# Patient Record
Sex: Female | Born: 1990 | Race: Black or African American | Hispanic: No | Marital: Single | Smoking: Never smoker
Health system: Southern US, Community
[De-identification: ages and names within clinical notes are randomized; demographics above are authoritative.]

## PROBLEM LIST (undated history)

## (undated) DIAGNOSIS — Z789 Other specified health status: Secondary | ICD-10-CM

## (undated) HISTORY — PX: NO PAST SURGERIES: SHX2092

---
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
## 2010-07-24 ENCOUNTER — Emergency Department (HOSPITAL_COMMUNITY): Payer: Self-pay

## 2010-07-24 ENCOUNTER — Emergency Department (HOSPITAL_COMMUNITY)
Admission: EM | Admit: 2010-07-24 | Discharge: 2010-07-24 | Disposition: A | Discharge status: Home / Self Care | Payer: Self-pay | Attending: Emergency Medicine | Admitting: Emergency Medicine

## 2010-07-24 DIAGNOSIS — S01119A Laceration without foreign body of unspecified eyelid and periocular area, initial encounter: Secondary | ICD-10-CM | POA: Insufficient documentation

## 2010-10-25 LAB — RUBELLA ANTIBODY, IGM: Rubella: IMMUNE

## 2010-10-25 LAB — RPR: RPR: NONREACTIVE

## 2010-10-25 LAB — ANTIBODY SCREEN: Antibody Screen: NEGATIVE

## 2010-10-25 LAB — HIV ANTIBODY (ROUTINE TESTING W REFLEX): HIV: NONREACTIVE

## 2011-03-15 NOTE — L&D Delivery Note (Signed)
Delivery Note  Pt was complete at 0240 without a strong urge to push, so labored down until about 0315. Pushed well, FHR 150's with some mild variables, overall reassuring   At 4:15 AM a viable female was delivered via Vaginal, Spontaneous Delivery (Presentation: Right Occiput Anterior). Loose nuchal cord reduced easily. Infant placed on mom's abdomen, dried and stimulated, cord doubly clamped and FOB cut cord.   APGAR: 9, 9; weight 6 lb 12 oz (3062 g).   Placenta status: Intact, Spontaneous.  Cord: 3 vessels with the following complications: None.   Routine cord blood collected Placenta sent to path for prolonged ROM, marginal cord insertion noted  Anesthesia: Epidural  Episiotomy: None Lacerations: None Suture Repair: n/a Est. Blood Loss (mL): 200  Mom to postpartum.  Baby to nursery-stable. Skin-skin Mom plans to BF Plans OP circumcision Dr Normand Sloop, notified   Raye Slyter M 04/25/2011, 5:11 AM

## 2011-04-01 LAB — GC/CHLAMYDIA PROBE AMP, GENITAL
Chlamydia: NEGATIVE
Gonorrhea: NEGATIVE

## 2011-04-04 LAB — STREP B DNA PROBE: GBS: NEGATIVE

## 2011-04-24 ENCOUNTER — Inpatient Hospital Stay (HOSPITAL_COMMUNITY): Payer: Medicaid Other | Admitting: Anesthesiology

## 2011-04-24 ENCOUNTER — Encounter (HOSPITAL_COMMUNITY): Payer: Self-pay | Admitting: *Deleted

## 2011-04-24 ENCOUNTER — Encounter (HOSPITAL_COMMUNITY): Payer: Self-pay | Admitting: Anesthesiology

## 2011-04-24 ENCOUNTER — Inpatient Hospital Stay (HOSPITAL_COMMUNITY)
Admission: AD | Admit: 2011-04-24 | Discharge: 2011-04-27 | DRG: 775 | Disposition: A | Discharge status: Home / Self Care | Payer: Medicaid Other | Source: Ambulatory Visit | Attending: Obstetrics and Gynecology | Admitting: Obstetrics and Gynecology

## 2011-04-24 DIAGNOSIS — IMO0002 Reserved for concepts with insufficient information to code with codable children: Secondary | ICD-10-CM | POA: Diagnosis present

## 2011-04-24 DIAGNOSIS — O4290 Premature rupture of membranes, unspecified as to length of time between rupture and onset of labor, unspecified weeks of gestation: Principal | ICD-10-CM | POA: Diagnosis present

## 2011-04-24 DIAGNOSIS — O9081 Anemia of the puerperium: Secondary | ICD-10-CM | POA: Diagnosis not present

## 2011-04-24 DIAGNOSIS — Z3400 Encounter for supervision of normal first pregnancy, unspecified trimester: Secondary | ICD-10-CM

## 2011-04-24 HISTORY — DX: Other specified health status: Z78.9

## 2011-04-24 LAB — POCT FERN TEST: Fern Test: POSITIVE

## 2011-04-24 LAB — CBC
Hemoglobin: 11.9 g/dL — ABNORMAL LOW (ref 12.0–15.0)
MCH: 31.5 pg (ref 26.0–34.0)
MCHC: 34.2 g/dL (ref 30.0–36.0)
Platelets: 190 10*3/uL (ref 150–400)
RDW: 13.3 % (ref 11.5–15.5)

## 2011-04-24 MED ORDER — BUTORPHANOL TARTRATE 2 MG/ML IJ SOLN: 1.0000 mg | INTRAMUSCULAR | Status: DC | PRN

## 2011-04-24 MED ORDER — CITRIC ACID-SODIUM CITRATE 334-500 MG/5ML PO SOLN: 30.0000 mL | ORAL | Status: DC | PRN

## 2011-04-24 MED ORDER — DIPHENHYDRAMINE HCL 50 MG/ML IJ SOLN: 12.5000 mg | INTRAMUSCULAR | Status: DC | PRN

## 2011-04-24 MED ORDER — ACETAMINOPHEN 325 MG PO TABS
650.0000 mg | ORAL_TABLET | ORAL | Status: DC | PRN
  Administered 2011-04-25: 650 mg via ORAL
  Filled 2011-04-24: qty 2

## 2011-04-24 MED ORDER — FLEET ENEMA 7-19 GM/118ML RE ENEM: 1.0000 | ENEMA | RECTAL | Status: DC | PRN

## 2011-04-24 MED ORDER — LACTATED RINGERS IV SOLN
500.0000 mL | INTRAVENOUS | Status: DC | PRN
  Administered 2011-04-24: 1000 mL via INTRAVENOUS

## 2011-04-24 MED ORDER — OXYTOCIN BOLUS FROM INFUSION
500.0000 mL | Freq: Once | INTRAVENOUS | Status: DC
  Filled 2011-04-24: qty 1000
  Filled 2011-04-24: qty 500

## 2011-04-24 MED ORDER — OXYTOCIN 20 UNITS IN LACTATED RINGERS INFUSION - SIMPLE
1.0000 m[IU]/min | INTRAVENOUS | Status: DC
  Administered 2011-04-24: 3 m[IU]/min via INTRAVENOUS
  Administered 2011-04-24: 1 m[IU]/min via INTRAVENOUS

## 2011-04-24 MED ORDER — TERBUTALINE SULFATE 1 MG/ML IJ SOLN: 0.2500 mg | Freq: Once | INTRAMUSCULAR | Status: AC | PRN

## 2011-04-24 MED ORDER — FENTANYL 2.5 MCG/ML BUPIVACAINE 1/10 % EPIDURAL INFUSION (WH - ANES)
14.0000 mL/h | INTRAMUSCULAR | Status: DC
  Administered 2011-04-24 – 2011-04-25 (×2): 14 mL/h via EPIDURAL
  Filled 2011-04-24 (×2): qty 60

## 2011-04-24 MED ORDER — EPHEDRINE 5 MG/ML INJ: 10.0000 mg | INTRAVENOUS | Status: DC | PRN

## 2011-04-24 MED ORDER — OXYTOCIN 20 UNITS IN LACTATED RINGERS INFUSION - SIMPLE
125.0000 mL/h | Freq: Once | INTRAVENOUS | Status: AC
  Administered 2011-04-25: 999 mL/h via INTRAVENOUS

## 2011-04-24 MED ORDER — OXYCODONE-ACETAMINOPHEN 5-325 MG PO TABS: 1.0000 | ORAL_TABLET | ORAL | Status: DC | PRN

## 2011-04-24 MED ORDER — LIDOCAINE HCL (PF) 1 % IJ SOLN
30.0000 mL | INTRAMUSCULAR | Status: DC | PRN
  Filled 2011-04-24: qty 30

## 2011-04-24 MED ORDER — EPHEDRINE 5 MG/ML INJ
10.0000 mg | INTRAVENOUS | Status: DC | PRN
  Filled 2011-04-24: qty 4

## 2011-04-24 MED ORDER — LIDOCAINE HCL (PF) 1 % IJ SOLN
INTRAMUSCULAR | Status: DC | PRN
  Administered 2011-04-24 (×3): 4 mL

## 2011-04-24 MED ORDER — IBUPROFEN 600 MG PO TABS: 600.0000 mg | ORAL_TABLET | Freq: Four times a day (QID) | ORAL | Status: DC | PRN

## 2011-04-24 MED ORDER — LACTATED RINGERS IV SOLN
INTRAVENOUS | Status: DC
  Administered 2011-04-24 (×2): via INTRAVENOUS

## 2011-04-24 MED ORDER — FENTANYL CITRATE 0.05 MG/ML IJ SOLN
100.0000 ug | INTRAMUSCULAR | Status: DC | PRN
  Administered 2011-04-24 (×3): 100 ug via INTRAVENOUS
  Filled 2011-04-24 (×4): qty 2

## 2011-04-24 MED ORDER — PHENYLEPHRINE 40 MCG/ML (10ML) SYRINGE FOR IV PUSH (FOR BLOOD PRESSURE SUPPORT): 80.0000 ug | PREFILLED_SYRINGE | INTRAVENOUS | Status: DC | PRN

## 2011-04-24 MED ORDER — PHENYLEPHRINE 40 MCG/ML (10ML) SYRINGE FOR IV PUSH (FOR BLOOD PRESSURE SUPPORT)
80.0000 ug | PREFILLED_SYRINGE | INTRAVENOUS | Status: DC | PRN
  Filled 2011-04-24: qty 5

## 2011-04-24 MED ORDER — LACTATED RINGERS IV SOLN: 500.0000 mL | Freq: Once | INTRAVENOUS | Status: DC

## 2011-04-24 MED ORDER — ONDANSETRON HCL 4 MG/2ML IJ SOLN
4.0000 mg | Freq: Four times a day (QID) | INTRAMUSCULAR | Status: DC | PRN
  Administered 2011-04-24: 4 mg via INTRAVENOUS
  Filled 2011-04-24: qty 2

## 2011-04-24 NOTE — Progress Notes (Signed)
Patient ID: Alexis Henderson, female   DOB: 03-19-1990, 21 y.o.   MRN: [id number] .Subjective: Fentanyl helping, declines epidural, requests VE   Objective: BP 117/70  Pulse 95  Temp(Src) 98.1 F (36.7 C) (Oral)  Resp 18  Ht 5\' 2"  (1.575 m)  Wt 62.415 kg (137 lb 9.6 oz)  BMI 25.17 kg/m2   FHT:  FHR: 130 bpm, variability: moderate,  accelerations:  Present,  decelerations:  Absent UC:   regular, every 2 minutes SVE:   Dilation: 2 Effacement (%): 90;100 Station: -1;0 Exam by:: S. Humphrey Guerreiro CNM (via [place])  Pitocin at 3mu  Assessment / Plan: pitocin augmentation for SROM, no cervical change GBS neg  Consider IUPC if no change  Fetal Wellbeing:  Category I Pain Control:  Fentanyl  Update physician PRN  [name] 04/24/2011, 5:54 PM

## 2011-04-24 NOTE — Progress Notes (Signed)
Dr. Normand Sloop updated on paitent status. To consult with S. Lillard CNM.

## 2011-04-24 NOTE — Progress Notes (Signed)
Encouraged pt to change positions, offered labor ball.Marland KitchenMarland Kitchen

## 2011-04-24 NOTE — Progress Notes (Signed)
Pt presents to [facility] with chief complaint of back pain, here for labor eval. Pt says the contractions started this morning around 4am, Pt is 104w4d.

## 2011-04-24 NOTE — Progress Notes (Signed)
Pt reports ahving lower back pain on and off since earlier this morning. Reports mucus plug came out last night and having good fetal movement.

## 2011-04-24 NOTE — Progress Notes (Signed)
Patient ID: Alexis Henderson, female   DOB: [birth year], 21 y.o.   MRN: [id number] .Subjective: C/o increased pain now, requests epidural, breathing with ctx, family supportive at bs   Objective: BP 120/51  Pulse 97  Temp(Src) 98 F (36.7 C) (Oral)  Resp 20  Ht 5\' 2"  (1.575 m)  Wt 62.415 kg (137 lb 9.6 oz)  BMI 25.17 kg/m2   FHT:  FHR: 140 bpm, variability: moderate,  accelerations:  Present,  decelerations:  Present variables,  UC:   regular, every 1-3 minutes SVE:   Dilation: 2.5 Effacement (%): 100 Station: +1 Exam by:: [employer] RN  Pitocin at 5mu cx is now 4cm  Assessment / Plan: Induction of labor due to PROM,  progressing well on pitocin GBS neg Some uterine tachysystole, will decrease pitocin and get epidural Consider IUPC    Fetal Wellbeing:  Category I Pain Control:  Fentanyl  Dr Normand Sloop updated  [name] 04/24/2011, 10:18 PM

## 2011-04-24 NOTE — Anesthesia Preprocedure Evaluation (Signed)
Anesthesia Evaluation  Patient identified by MRN, date of birth, ID band Patient awake    Reviewed: Allergy & Precautions, H&P , NPO status , Patient's Chart, lab work & pertinent test results, reviewed documented beta blocker date and time   History of Anesthesia Complications Negative for: history of anesthetic complications  Airway Mallampati: II TM Distance: >3 FB Neck ROM: full    Dental  (+) Teeth Intact   Pulmonary neg pulmonary ROS,  clear to auscultation        Cardiovascular neg cardio ROS regular Normal    Neuro/Psych Negative Neurological ROS  Negative Psych ROS   GI/Hepatic negative GI ROS, Neg liver ROS,   Endo/Other  Negative Endocrine ROS  Renal/GU negative Renal ROS     Musculoskeletal   Abdominal   Peds  Hematology negative hematology ROS (+)   Anesthesia Other Findings   Reproductive/Obstetrics (+) Pregnancy                           Anesthesia Physical Anesthesia Plan  ASA: II  Anesthesia Plan: Epidural   Post-op Pain Management:    Induction:   Airway Management Planned:   Additional Equipment:   Intra-op Plan:   Post-operative Plan:   Informed Consent: I have reviewed the patients History and Physical, chart, labs and discussed the procedure including the risks, benefits and alternatives for the proposed anesthesia with the patient or authorized representative who has indicated his/her understanding and acceptance.     Plan Discussed with:   Anesthesia Plan Comments:         Anesthesia Quick Evaluation  

## 2011-04-24 NOTE — Progress Notes (Signed)
New orders received.

## 2011-04-24 NOTE — Progress Notes (Signed)
Bedside US completed and vertex position confirmed per. Gevena Barre CNM

## 2011-04-24 NOTE — Progress Notes (Signed)
Maternal heart rate noted on fetal tracing. Patient sitting in high fowlers vominting.

## 2011-04-24 NOTE — H&P (Signed)
Alexis Henderson is a 21 y.o. female presenting for labor.Pt had SROM with clear fluid and mild blood tinge at 0400.  Pt also with contractions q Pregnancy complicated by 1. Circumvillate placenta with marginal CI  Pt had normal QUAD screens and growth [place] 2. BV treated with Flagyl @IPILAPH @ OB History    Grav Para Term Preterm Abortions TAB SAB Ect Mult Living   1 0 0 0 0 0 0 0 0 0      Past Medical History  Diagnosis Date  . No pertinent past medical history    Past Surgical History  Procedure Date  . No past surgeries    Family History: family history includes Diabetes in her father. Social History:  reports that she has never smoked. She does not have any smokeless tobacco history on file. She reports that she does not drink alcohol or use illicit drugs.  Review of Systems - History obtained from the patient General ROS: negative Psychological ROS: negative Respiratory ROS: no cough, shortness of breath, or wheezing Cardiovascular ROS: no chest pain or dyspnea on exertion Gastrointestinal ROS: no abdominal pain, change in bowel habits, or black or bloody stools Fetal heart tones 145-150 reactive toco q  Dilation: 2 Effacement (%): 100 Station: 0 Exam by:: Dr. Normand Sloop Blood pressure 127/76, pulse 94, temperature 99.9 F (37.7 C), temperature source Oral, resp. rate 18, height 5\' 2"  (1.575 m), weight 62.415 kg (137 lb 9.6 oz). Physical Examination: General appearance - alert, well appearing, and in no distress, oriented to person, place, and time and anxious Eyes - pupils equal and reactive, extraocular eye movements intact Ears - bilateral TM's and external ear canals normal, not examined Chest - clear to auscultation, no wheezes, rales or rhonchi, symmetric air entry Heart - normal rate, regular rhythm, normal S1, S2, no murmurs, rubs, clicks or gallops Abdomen - soft, nontender, nondistended, no masses or organomegaly Pelvic - normal external genitalia, ,  VULVA: normal appearing vulva with no masses, tenderness or lesions Back exam - full range of motion, no tenderness, palpable spasm or pain on motion Neurological - alert, oriented, normal speech, no focal findings or movement disorder noted Musculoskeletal - no joint tenderness, deformity or swelling Extremities - peripheral pulses normal, no pedal edema, no clubbing or cyanosis Skin - normal coloration and turgor, no rashes, no suspicious skin lesions noted Prenatal labs: ABO, Rh:   Antibody:   Rubella:   RPR:    HBsAg:    HIV:    GBS: Negative (01/21 0000)   Assessment/Plan: Active labor with SROM at term Epidural PRN Anticipate SVD   Alexis Henderson A 04/24/2011, 12:36 PM

## 2011-04-24 NOTE — Anesthesia Procedure Notes (Signed)
Epidural Patient location during procedure: OB Start time: 04/24/2011 10:39 PM Reason for block: procedure for pain  Staffing Performed by: anesthesiologist   Preanesthetic Checklist Completed: patient identified, site marked, surgical consent, pre-op evaluation, timeout performed, IV checked, risks and benefits discussed and monitors and equipment checked  Epidural Patient position: sitting Prep: site prepped and draped and DuraPrep Patient monitoring: continuous pulse ox and blood pressure Approach: midline Injection technique: LOR air  Needle:  Needle type: Tuohy  Needle gauge: 17 G Needle length: 9 cm Catheter type: closed end flexible Catheter size: 19 Gauge Test dose: negative  Assessment Events: blood not aspirated, injection not painful, no injection resistance, negative IV test and no paresthesia  Additional Notes Discussed risk of headache, infection, bleeding, nerve injury and failed or incomplete block.  Patient voices understanding and wishes to proceed.

## 2011-04-24 NOTE — Progress Notes (Signed)
Patient ID: Alexis Henderson, female   DOB: 05/09/1990, 21 y.o.   MRN: [id number] .Subjective: Pt c/o increased pain, desires IV meds, not ready for epidural yet, agrees to start pitocin    Objective: BP 125/88  Pulse 100  Temp(Src) 97.7 F (36.5 C) (Oral)  Resp 18  Ht 5\' 2"  (1.575 m)  Wt 62.415 kg (137 lb 9.6 oz)  BMI 25.17 kg/m2   FHT:  FHR: 150 bpm, variability: moderate,  accelerations:  Present,  decelerations:  Absent UC:   irregular, every 3-6 minutes SVE:   Dilation: 2 Effacement (%): 90;100 Station: -1;0 Exam by:: S. Shrihaan Porzio CNM (via [place])  Vertex confirmed via UA, VE deferred  Assessment / Plan: SROM GBS neg  Will give fentanyl IVP Start pitocin   Fetal Wellbeing:  Category I Pain Control:  Fentanyl  Update physician PRN  [name] 04/24/2011, 3:01 PM

## 2011-04-25 ENCOUNTER — Other Ambulatory Visit: Payer: Self-pay | Admitting: Obstetrics and Gynecology

## 2011-04-25 ENCOUNTER — Encounter (HOSPITAL_COMMUNITY): Payer: Self-pay | Admitting: *Deleted

## 2011-04-25 LAB — ABO/RH: ABO/RH(D): O POS

## 2011-04-25 MED ORDER — ONDANSETRON HCL 4 MG/2ML IJ SOLN: 4.0000 mg | INTRAMUSCULAR | Status: DC | PRN

## 2011-04-25 MED ORDER — BENZOCAINE-MENTHOL 20-0.5 % EX AERO: 1.0000 "application " | INHALATION_SPRAY | CUTANEOUS | Status: DC | PRN

## 2011-04-25 MED ORDER — DIBUCAINE 1 % RE OINT: 1.0000 "application " | TOPICAL_OINTMENT | RECTAL | Status: DC | PRN

## 2011-04-25 MED ORDER — SIMETHICONE 80 MG PO CHEW: 80.0000 mg | CHEWABLE_TABLET | ORAL | Status: DC | PRN

## 2011-04-25 MED ORDER — PRENATAL MULTIVITAMIN CH
1.0000 | ORAL_TABLET | Freq: Every day | ORAL | Status: DC
  Administered 2011-04-25 – 2011-04-27 (×3): 1 via ORAL
  Filled 2011-04-25 (×3): qty 1

## 2011-04-25 MED ORDER — TETANUS-DIPHTH-ACELL PERTUSSIS 5-2.5-18.5 LF-MCG/0.5 IM SUSP: 0.5000 mL | Freq: Once | INTRAMUSCULAR | Status: DC

## 2011-04-25 MED ORDER — BENZOCAINE-MENTHOL 20-0.5 % EX AERO
INHALATION_SPRAY | CUTANEOUS | Status: AC
  Administered 2011-04-25: 11:00:00
  Filled 2011-04-25: qty 56

## 2011-04-25 MED ORDER — IBUPROFEN 600 MG PO TABS
600.0000 mg | ORAL_TABLET | Freq: Four times a day (QID) | ORAL | Status: DC
  Administered 2011-04-25 – 2011-04-27 (×9): 600 mg via ORAL
  Filled 2011-04-25 (×9): qty 1

## 2011-04-25 MED ORDER — MEASLES, MUMPS & RUBELLA VAC ~~LOC~~ INJ
0.5000 mL | INJECTION | Freq: Once | SUBCUTANEOUS | Status: DC
  Filled 2011-04-25: qty 0.5

## 2011-04-25 MED ORDER — WITCH HAZEL-GLYCERIN EX PADS: 1.0000 "application " | MEDICATED_PAD | CUTANEOUS | Status: DC | PRN

## 2011-04-25 MED ORDER — ONDANSETRON HCL 4 MG PO TABS: 4.0000 mg | ORAL_TABLET | ORAL | Status: DC | PRN

## 2011-04-25 MED ORDER — OXYCODONE-ACETAMINOPHEN 5-325 MG PO TABS: 1.0000 | ORAL_TABLET | ORAL | Status: DC | PRN

## 2011-04-25 MED ORDER — LANOLIN HYDROUS EX OINT: TOPICAL_OINTMENT | CUTANEOUS | Status: DC | PRN

## 2011-04-25 MED ORDER — SENNOSIDES-DOCUSATE SODIUM 8.6-50 MG PO TABS
2.0000 | ORAL_TABLET | Freq: Every day | ORAL | Status: DC
  Administered 2011-04-25: 2 via ORAL

## 2011-04-25 MED ORDER — DIPHENHYDRAMINE HCL 25 MG PO CAPS: 25.0000 mg | ORAL_CAPSULE | Freq: Four times a day (QID) | ORAL | Status: DC | PRN

## 2011-04-25 MED ORDER — ZOLPIDEM TARTRATE 5 MG PO TABS: 5.0000 mg | ORAL_TABLET | Freq: Every evening | ORAL | Status: DC | PRN

## 2011-04-25 NOTE — Progress Notes (Signed)
SVD of viable female 

## 2011-04-25 NOTE — Anesthesia Postprocedure Evaluation (Signed)
  Anesthesia Post-op Note  Patient: Alexis Henderson  Procedure(s) Performed: * No procedures listed *  Patient Location: Mother/Baby  Anesthesia Type: Epidural  Level of Consciousness: awake, alert  and oriented  Airway and Oxygen Therapy: Patient Spontanous Breathing  Post-op Pain: none  Post-op Assessment: Post-op Vital signs reviewed, Patient's Cardiovascular Status Stable, No headache, No backache, No residual numbness and No residual motor weakness  Post-op Vital Signs: Reviewed and stable  Complications: No apparent anesthesia complications

## 2011-04-25 NOTE — Progress Notes (Signed)
Patient ID: Alexis Henderson, female   DOB: [birth year], 21 y.o.   MRN: [id number] .Subjective: Comfortable with epidural   Objective: BP 120/60  Pulse 98  Temp(Src) 98.3 F (36.8 C) (Oral)  Resp 18  Ht 5\' 2"  (1.575 m)  Wt 62.415 kg (137 lb 9.6 oz)  BMI 25.17 kg/m2   FHT:  FHR: 150 bpm, variability: moderate,  accelerations:  Present,  decelerations:  Present variables UC:   regular, every 2-3 minutes SVE:   Dilation: 6 Effacement (%): 100 Station: +1 Exam by:: Sanda Klein CNM  Pitocin at 3mu  Assessment / Plan: Induction of labor due to PROM,  progressing well on pitocin GBS  IUPC placed   Fetal Wellbeing:  Category I Pain Control:  Epidural  Update physician PRN  [name] 04/25/2011, 2:56 AM

## 2011-04-26 LAB — CBC
HCT: 31.6 % — ABNORMAL LOW (ref 36.0–46.0)
MCV: 93.5 fL (ref 78.0–100.0)
Platelets: 165 10*3/uL (ref 150–400)
RBC: 3.38 MIL/uL — ABNORMAL LOW (ref 3.87–5.11)
WBC: 13.2 10*3/uL — ABNORMAL HIGH (ref 4.0–10.5)

## 2011-04-26 NOTE — Progress Notes (Signed)
UR Chart review completed.  

## 2011-04-26 NOTE — Progress Notes (Signed)
Post Partum Day 1 Subjective: no complaints.  Doing well.  Breastfeeding going well. Undecided regarding contraception.  Denies syncope or dizziness.  Up ad lib.  Objective: Blood pressure 100/67, pulse 76, temperature 97.5 F (36.4 C), temperature source Oral, resp. rate 18, height 5\' 2"  (1.575 m), weight 62.415 kg (137 lb 9.6 oz), SpO2 99.00%, unknown if currently breastfeeding.  Physical Exam:  General: alert Lochia: appropriate Uterine Fundus: firm Incision: Clear perineum DVT Evaluation: No evidence of DVT seen on physical exam. Negative Homan's sign.   Basename 04/26/11 0625 04/24/11 1255  HGB 10.8* 11.9*  HCT 31.6* 34.8*    Assessment/Plan: Plan for discharge tomorrow Continue current care.    LOS: 2 days   Alexis Henderson, Chip Boer 04/26/2011, 8:22 AM

## 2011-04-27 DIAGNOSIS — O9081 Anemia of the puerperium: Secondary | ICD-10-CM | POA: Diagnosis not present

## 2011-04-27 MED ORDER — IBUPROFEN 600 MG PO TABS: 600.0000 mg | ORAL_TABLET | Freq: Four times a day (QID) | ORAL | Status: AC | PRN

## 2011-04-27 MED ORDER — PRENATAL MULTIVITAMIN CH: 1.0000 | ORAL_TABLET | Freq: Every day | ORAL | Status: DC

## 2011-04-27 NOTE — Discharge Instructions (Signed)
Vaginal Delivery Care After  Change your pad on each trip to the bathroom.   Wipe gently with toilet paper during your hospital stay. Always wipe from front to back. A spray bottle with warm tap water could also be used or a towelette if available.   Place your soiled pad and toilet paper in a bathroom wastebasket with a plastic bag liner.   During your hospital stay, save any clots. If you pass a clot while on the toilet, do not flush it. Also, if your vaginal flow seems excessive to you, notify nursing personnel.   The first time you get out of bed after delivery, wait for assistance from a nurse. Do not get up alone at any time if you feel weak or dizzy.   Bend and extend your ankles forcefully so that you feel the calves of your legs get hard. Do this 6 times every hour when you are in bed and awake.   Do not sit with one foot under you, dangle your legs over the edge of the bed, or maintain a position that hinders the circulation in your legs.   Many women experience after pains for 2 to 3 days after delivery. These after pains are mild uterine contractions. Ask the nurse for a pain medication if you need something for this. Sometimes breastfeeding stimulates after pains; if you find this to be true, ask for the medication  -  hour before the next feeding.   For you and your infant's protection, do not go beyond the door(s) of the obstetric unit. Do not carry your baby in your arms in the hallway. When taking your baby to and from your room, put your baby in the bassinet and push the bassinet.   Mothers may have their babies in their room as much as they desire.   Breastfeeding BENEFITS OF BREASTFEEDING For the baby  The first milk (colostrum) helps the baby's digestive system function better.   There are antibodies from the mother in the milk that help the baby fight off infections.   The baby has a lower incidence of asthma, allergies, and SIDS (sudden infant death syndrome).     The nutrients in breast milk are better than formulas for the baby and helps the baby's brain grow better.   Babies who breastfeed have less gas, colic, and constipation.  For the mother  Breastfeeding helps develop a very special bond between mother and baby.   It is more convenient, always available at the correct temperature and cheaper than formula feeding.   It burns calories in the mother and helps with losing weight that was gained during pregnancy.   It makes the uterus contract back down to normal size faster and slows bleeding following delivery.   Breastfeeding mothers have a lower risk of developing breast cancer.  NURSE FREQUENTLY  A healthy, full-term baby may breastfeed as often as every hour or space his or her feedings to every 3 hours.   How often to nurse will vary from baby to baby. Watch your baby for signs of hunger, not the clock.   Nurse as often as the baby requests, or when you feel the need to reduce the fullness of your breasts.   Awaken the baby if it has been 3 to 4 hours since the last feeding.   Frequent feeding will help the mother make more milk and will prevent problems like sore nipples and engorgement of the breasts.  BABY'S POSITION AT THE BREAST  Whether lying down or sitting, be sure that the baby's tummy is facing your tummy.   Support the breast with 4 fingers underneath the breast and the thumb above. Make sure your fingers are well away from the nipple and baby's mouth.   Stroke the baby's lips and cheek closest to the breast gently with your finger or nipple.   When the baby's mouth is open wide enough, place all of your nipple and as much of the dark area around the nipple as possible into your baby's mouth.   Pull the baby in close so the tip of the nose and the baby's cheeks touch the breast during the feeding.  FEEDINGS  The length of each feeding varies from baby to baby and from feeding to feeding.   The baby must suck  about 2 to 3 minutes for your milk to get to him or her. This is called a "let down." For this reason, allow the baby to feed on each breast as long as he or she wants. Your baby will end the feeding when he or she has received the right balance of nutrients.   To break the suction, put your finger into the corner of the baby's mouth and slide it between his or her gums before removing your breast from his or her mouth. This will help prevent sore nipples.  REDUCING BREAST ENGORGEMENT  In the first week after your baby is born, you may experience signs of breast engorgement. When breasts are engorged, they feel heavy, warm, full, and may be tender to the touch. You can reduce engorgement if you:   Nurse frequently, every 2 to 3 hours. Mothers who breastfeed early and often have fewer problems with engorgement.   Place light ice packs on your breasts between feedings. This reduces swelling. Wrap the ice packs in a lightweight towel to protect your skin.   Apply moist hot packs to your breast for 5 to 10 minutes before each feeding. This increases circulation and helps the milk flow.   Gently massage your breast before and during the feeding.   Make sure that the baby empties at least one breast at every feeding before switching sides.   Use a breast pump to empty the breasts if your baby is sleepy or not nursing well. You may also want to pump if you are returning to work or or you feel you are getting engorged.   Avoid bottle feeds, pacifiers or supplemental feedings of water or juice in place of breastfeeding.   Be sure the baby is latched on and positioned properly while breastfeeding.   Prevent fatigue, stress, and anemia.   Wear a supportive bra, avoiding underwire styles.   Eat a balanced diet with enough fluids.  If you follow these suggestions, your engorgement should improve in 24 to 48 hours. If you are still experiencing difficulty, call your lactation consultant or  caregiver. IS MY BABY GETTING ENOUGH MILK? Sometimes, mothers worry about whether their babies are getting enough milk. You can be assured that your baby is getting enough milk if:  The baby is actively sucking and you hear swallowing.   The baby nurses at least 8 to 12 times in a 24 hour time period. Nurse your baby until he or she unlatches or falls asleep at the first breast (at least 10 to 20 minutes), then offer the second side.   The baby is wetting 5 to 6 disposable diapers (6 to 8 cloth diapers) in a  24 hour period by 37 to 54 days of age.   The baby is having at least 2 to 3 stools every 24 hours for the first few months. Breast milk is all the food your baby needs. It is not necessary for your baby to have water or formula. In fact, to help your breasts make more milk, it is best not to give your baby supplemental feedings during the early weeks.   The stool should be soft and yellow.   The baby should gain 4 to 7 ounces per week after he is 25 days old.  TAKE CARE OF YOURSELF Take care of your breasts by:  Bathing or showering daily.   Avoiding the use of soaps on your nipples.   Start feedings on your left breast at one feeding and on your right breast at the next feeding.   You will notice an increase in your milk supply 2 to 5 days after delivery. You may feel some discomfort from engorgement, which makes your breasts very firm and often tender. Engorgement "peaks" out within 24 to 48 hours. In the meantime, apply warm moist towels to your breasts for 5 to 10 minutes before feeding. Gentle massage and expression of some milk before feeding will soften your breasts, making it easier for your baby to latch on. Wear a well fitting nursing bra and air dry your nipples for 10 to 15 minutes after each feeding.   Only use cotton bra pads.   Only use pure lanolin on your nipples after nursing. You do not need to wash it off before nursing.  Take care of yourself by:   Eating  well-balanced meals and nutritious snacks.   Drinking milk, fruit juice, and water to satisfy your thirst (about 8 glasses a day).   Getting plenty of rest.   Increasing calcium in your diet (1200 mg a day).   Avoiding foods that you notice affect the baby in a bad way.  SEEK MEDICAL CARE IF:   You have any questions or difficulty with breastfeeding.   You need help.   You have a hard, red, sore area on your breast, accompanied by a fever of 100.5 F (38.1 C) or more.   Your baby is too sleepy to eat well or is having trouble sleeping.   Your baby is wetting less than 6 diapers per day, by 59 days of age.   Your baby's skin or white part of his or her eyes is more yellow than it was in the hospital.   You feel depressed.

## 2011-04-27 NOTE — Discharge Summary (Signed)
  Obstetric Discharge Summary Reason for Admission: rupture of membranes Prenatal Procedures: ultrasound Intrapartum Procedures: spontaneous vaginal delivery Postpartum Procedures: none Complications-Operative and Postpartum: none  Temp:  [97.7 F (36.5 C)-97.9 F (36.6 C)] 97.7 F (36.5 C) (02/13 0525) Pulse Rate:  [78-96] 79  (02/13 0525) Resp:  [18-20] 18  (02/13 0525) BP: (102-110)/(68-70) 102/68 mmHg (02/13 0525) SpO2:  [99 %] 99 % (02/12 2113) Hemoglobin  Date Value Range Status  04/26/2011 10.8* 12.0-15.0 (g/dL) Final     HCT  Date Value Range Status  04/26/2011 31.6* 36.0-46.0 (%) Final    Hospital Course:  Hospital Course: Admitted in labor. Negative GBS. Progressed to fully dilated. Delivery was performed by Gevena Barre, CNM without difficulty. Patient and baby tolerated the procedure without difficulty, without a laceration noted. Infant to FTN. Mother and infant then had an uncomplicated postpartum course, with breastfeeding feeding going well. Mom's physical exam was WNL, and she was discharged home in stable condition. Contraception plan was Nexplanon @ 6 weeks PP.  She received adequate benefit from po pain medications.  S: 21 y/o admitted in labor. Denies complaints presently. O: VSS  A: lochia scant; fundus firm -2; lungs clear; heart-RRR P: Advised nothing in vagina x 6 weeks. RTO x 5 weeks for PP exam and 6 weeks for Nexplanon insertion.  Discharge Diagnoses: Term Pregnancy-delivered  Discharge Information: Date: 04/27/2011 Activity: Nothing in vagina x 6 weeks Diet: routine Medications:  Medication List  As of 04/27/2011  8:53 AM   START taking these medications         ibuprofen 600 MG tablet   Commonly known as: ADVIL,MOTRIN   Take 1 tablet (600 mg total) by mouth every 6 (six) hours as needed for pain.         CONTINUE taking these medications         prenatal multivitamin Tabs   Take 1 tablet by mouth daily.         ASK your doctor about these  medications         PRESCRIPTION MEDICATION          Where to get your medications    These are the prescriptions that you need to pick up.   You may get these medications from any pharmacy.         ibuprofen 600 MG tablet   prenatal multivitamin Tabs           Condition: stable Instructions: refer to practice specific booklet Discharge to: home Follow-up Information    Follow up with [facility]. Schedule an appointment as soon as possible for a visit in 5 weeks.         Newborn Data: Live born  Information for the patient's newborn:  Lynlee, Stratton [[id number]]  female   Kizzie Fantasia CORI 04/27/2011, 8:53 AM

## 2011-05-30 ENCOUNTER — Ambulatory Visit (INDEPENDENT_AMBULATORY_CARE_PROVIDER_SITE_OTHER): Payer: Medicaid Other | Admitting: Obstetrics and Gynecology

## 2011-06-16 ENCOUNTER — Encounter (INDEPENDENT_AMBULATORY_CARE_PROVIDER_SITE_OTHER): Payer: Medicaid Other | Admitting: Obstetrics and Gynecology

## 2011-06-16 DIAGNOSIS — Z3040 Encounter for surveillance of contraceptives, unspecified: Secondary | ICD-10-CM

## 2011-06-16 DIAGNOSIS — Z30017 Encounter for initial prescription of implantable subdermal contraceptive: Secondary | ICD-10-CM

## 2011-06-24 ENCOUNTER — Encounter: Payer: Self-pay | Admitting: Obstetrics and Gynecology

## 2011-06-24 ENCOUNTER — Ambulatory Visit (INDEPENDENT_AMBULATORY_CARE_PROVIDER_SITE_OTHER): Payer: Medicaid Other | Admitting: Obstetrics and Gynecology

## 2011-06-24 VITALS — BP 98/60 | Wt 123.0 lb

## 2011-06-24 DIAGNOSIS — Z309 Encounter for contraceptive management, unspecified: Secondary | ICD-10-CM

## 2011-06-24 DIAGNOSIS — IMO0001 Reserved for inherently not codable concepts without codable children: Secondary | ICD-10-CM

## 2011-06-24 NOTE — Progress Notes (Signed)
Patient left without being seen.
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
## 2013-02-13 ENCOUNTER — Emergency Department (HOSPITAL_COMMUNITY)
Admission: EM | Admit: 2013-02-13 | Discharge: 2013-02-14 | Disposition: A | Discharge status: Home / Self Care | Payer: Medicaid Other | Attending: Emergency Medicine | Admitting: Emergency Medicine

## 2013-02-13 ENCOUNTER — Encounter (HOSPITAL_COMMUNITY): Payer: Self-pay | Admitting: Emergency Medicine

## 2013-02-13 DIAGNOSIS — Z3202 Encounter for pregnancy test, result negative: Secondary | ICD-10-CM | POA: Insufficient documentation

## 2013-02-13 DIAGNOSIS — N12 Tubulo-interstitial nephritis, not specified as acute or chronic: Secondary | ICD-10-CM | POA: Insufficient documentation

## 2013-02-13 DIAGNOSIS — M545 Low back pain, unspecified: Secondary | ICD-10-CM | POA: Insufficient documentation

## 2013-02-13 DIAGNOSIS — N390 Urinary tract infection, site not specified: Secondary | ICD-10-CM | POA: Insufficient documentation

## 2013-02-13 DIAGNOSIS — Z79899 Other long term (current) drug therapy: Secondary | ICD-10-CM | POA: Insufficient documentation

## 2013-02-13 MED ORDER — ACETAMINOPHEN 325 MG PO TABS
650.0000 mg | ORAL_TABLET | Freq: Once | ORAL | Status: AC
  Administered 2013-02-13: 650 mg via ORAL
  Filled 2013-02-13: qty 2

## 2013-02-13 MED ORDER — SODIUM CHLORIDE 0.9 % IV BOLUS (SEPSIS)
1000.0000 mL | Freq: Once | INTRAVENOUS | Status: AC
  Administered 2013-02-14: 1000 mL via INTRAVENOUS

## 2013-02-13 NOTE — ED Notes (Signed)
Pt c/o urinary urgency with dysuria and low back pain. Denies fever, denies n/v/d

## 2013-02-14 LAB — BASIC METABOLIC PANEL
BUN: 11 mg/dL (ref 6–23)
CO2: 22 mEq/L (ref 19–32)
Calcium: 9.8 mg/dL (ref 8.4–10.5)
Chloride: 96 mEq/L (ref 96–112)
Creatinine, Ser: 0.89 mg/dL (ref 0.50–1.10)
Glucose, Bld: 100 mg/dL — ABNORMAL HIGH (ref 70–99)

## 2013-02-14 LAB — CBC
HCT: 40.7 % (ref 36.0–46.0)
Hemoglobin: 14.4 g/dL (ref 12.0–15.0)
MCH: 30.7 pg (ref 26.0–34.0)
MCV: 86.8 fL (ref 78.0–100.0)
Platelets: 256 10*3/uL (ref 150–400)
RBC: 4.69 MIL/uL (ref 3.87–5.11)

## 2013-02-14 LAB — URINE MICROSCOPIC-ADD ON

## 2013-02-14 LAB — URINALYSIS, ROUTINE W REFLEX MICROSCOPIC
Specific Gravity, Urine: 1.019 (ref 1.005–1.030)
Urobilinogen, UA: 1 mg/dL (ref 0.0–1.0)

## 2013-02-14 MED ORDER — DEXTROSE 5 % IV SOLN
1.0000 g | Freq: Once | INTRAVENOUS | Status: AC
  Administered 2013-02-14: 1 g via INTRAVENOUS
  Filled 2013-02-14: qty 10

## 2013-02-14 MED ORDER — SULFAMETHOXAZOLE-TRIMETHOPRIM 800-160 MG PO TABS: 1.0000 | ORAL_TABLET | Freq: Two times a day (BID) | ORAL | Status: DC

## 2013-02-14 MED ORDER — SODIUM CHLORIDE 0.9 % IV BOLUS (SEPSIS)
500.0000 mL | Freq: Once | INTRAVENOUS | Status: AC
  Administered 2013-02-14: 500 mL via INTRAVENOUS

## 2013-02-14 MED ORDER — POTASSIUM CHLORIDE CRYS ER 20 MEQ PO TBCR
40.0000 meq | EXTENDED_RELEASE_TABLET | Freq: Once | ORAL | Status: AC
  Administered 2013-02-14: 40 meq via ORAL
  Filled 2013-02-14: qty 2

## 2013-02-14 NOTE — ED Provider Notes (Signed)
CSN: [id number]     Arrival date & time 02/13/13  2233 History   First MD Initiated Contact with Patient 02/13/13 2304     Chief Complaint  Patient presents with  . Urinary Tract Infection   (Consider location/radiation/quality/duration/timing/severity/associated sxs/prior Treatment) The history is provided by the patient and medical records. No language interpreter was used.    KEYUNDRA FANT is a 22 y.o. female  with no known medical history presents to the Emergency Department complaining of gradual, persistent, progressively worsening dysuria with associated frequency and urgency onset 4 days ago. Associated symptoms include low back pain right worse than left.  Nothing makes symptoms better or worse. Patient reports she is sexually active with one sexual partner, has no history of STD and has no vaginal discharge.  LMP Jan 11, 2013.     Past Medical History  Diagnosis Date  . No pertinent past medical history    Past Surgical History  Procedure Laterality Date  . No past surgeries     Family History  Problem Relation Age of Onset  . Diabetes Father    History  Substance Use Topics  . Smoking status: Never Smoker   . Smokeless tobacco: Never Used  . Alcohol Use: Yes     Comment: social   OB History   Grav Para Term Preterm Abortions TAB SAB Ect Mult Living   1 1 1  0 0 0 0 0 0 1     Review of Systems  Constitutional: Negative for fever, diaphoresis, appetite change, fatigue and unexpected weight change.  HENT: Negative for mouth sores and trouble swallowing.   Respiratory: Negative for cough, chest tightness, shortness of breath, wheezing and stridor.   Cardiovascular: Negative for chest pain and palpitations.  Gastrointestinal: Positive for abdominal pain. Negative for nausea, vomiting, diarrhea, constipation, blood in stool, abdominal distention and rectal pain.  Genitourinary: Positive for dysuria, urgency and frequency. Negative for hematuria, flank pain and  difficulty urinating.  Musculoskeletal: Positive for back pain. Negative for neck pain and neck stiffness.  Skin: Negative for rash.  Neurological: Negative for weakness.  Hematological: Negative for adenopathy.  Psychiatric/Behavioral: Negative for confusion.  All other systems reviewed and are negative.    Allergies  Review of patient's allergies indicates no known allergies.  Home Medications   Current Outpatient Rx  Name  Route  Sig  Dispense  Refill  . etonogestrel (NEXPLANON) 68 MG IMPL implant   Subcutaneous   Inject 1 each into the skin continuous.         Marland Kitchen sulfamethoxazole-trimethoprim (SEPTRA DS) 800-160 MG per tablet   Oral   Take 1 tablet by mouth every 12 (twelve) hours.   20 tablet   0    BP 100/58  Pulse 105  Temp(Src) 98.2 F (36.8 C) (Oral)  Resp 18  Ht 5\' 2"  (1.575 m)  Wt 123 lb (55.792 kg)  BMI 22.49 kg/m2  SpO2 98%  LMP 12/14/2012  Breastfeeding? No Physical Exam  Nursing note and vitals reviewed. Constitutional: She appears well-developed and well-nourished. No distress.  Awake, alert, nontoxic appearance  HENT:  Head: Normocephalic and atraumatic.  Mouth/Throat: Oropharynx is clear and moist. No oropharyngeal exudate.  Eyes: Conjunctivae are normal. No scleral icterus.  Neck: Normal range of motion. Neck supple.  Cardiovascular: Normal rate, regular rhythm, normal heart sounds and intact distal pulses.   No murmur heard. Pulmonary/Chest: Effort normal and breath sounds normal. No respiratory distress. She has no wheezes.  Abdominal: Soft. Normal appearance  and bowel sounds are normal. She exhibits no distension and no mass. There is no tenderness. There is CVA tenderness (right). There is no rebound and no guarding.  Abdomen soft and nontender Right CVA tenderness  Musculoskeletal: Normal range of motion. She exhibits no edema.  Lymphadenopathy:    She has no cervical adenopathy.  Neurological: She is alert. She exhibits normal muscle  tone. Coordination normal.  Speech is clear and goal oriented Moves extremities without ataxia  Skin: Skin is warm and dry. She is not diaphoretic. No erythema.  Psychiatric: She has a normal mood and affect.    ED Course  Procedures (including critical care time) Labs Review Labs Reviewed  URINALYSIS, ROUTINE W REFLEX MICROSCOPIC - Abnormal; Notable for the following:    APPearance TURBID (*)    Hgb urine dipstick LARGE (*)    Ketones, ur 15 (*)    Protein, ur 30 (*)    Nitrite POSITIVE (*)    Leukocytes, UA LARGE (*)    All other components within normal limits  CBC - Abnormal; Notable for the following:    WBC 13.7 (*)    All other components within normal limits  BASIC METABOLIC PANEL - Abnormal; Notable for the following:    Sodium 132 (*)    Potassium 3.2 (*)    Glucose, Bld 100 (*)    All other components within normal limits  URINE MICROSCOPIC-ADD ON - Abnormal; Notable for the following:    Bacteria, UA MANY (*)    All other components within normal limits  URINE CULTURE  POCT PREGNANCY, URINE   Imaging Review No results found.  EKG Interpretation   None       MDM   1. Pyelonephritis      [name] presents with Hx and PE consistent with UTI vs pyelonephritis.  Pt has been diagnosed with a UTI. Pt is febrile, right CVA tenderness and tachycardic.  Will give fluids and rocephin.  Is alert and oriented nontoxic, nonseptic appearing.  She denies nausea and vomiting and is tolerating by mouth here in the department.  1:21 AM Patient with resolution of fever and tachycardia has decreased from 130 to 105.  Pt will be given of fluid and Rocephin.  Pt continues to be well appearing without N/V.  Plan is to  dc home with PO antibiotics and instructions to follow up with PCP if symptoms persist.   It has been determined that no acute conditions requiring further emergency intervention are present at this time. The patient/guardian have been advised of  the diagnosis and plan. We have discussed signs and symptoms that warrant return to the ED, such as changes or worsening in symptoms.   Vital signs are stable at discharge.   BP 100/58  Pulse 105  Temp(Src) 98.2 F (36.8 C) (Oral)  Resp 18  Ht 5\' 2"  (1.575 m)  Wt 123 lb (55.792 kg)  BMI 22.49 kg/m2  SpO2 98%  LMP 12/14/2012  Breastfeeding? No  Patient/guardian has voiced understanding and agreed to follow-up with the PCP or specialist.      Dierdre Forth, PA-C 02/14/13 2124

## 2013-02-15 NOTE — ED Provider Notes (Signed)
Medical screening examination/treatment/procedure(s) were performed by non-physician practitioner and as supervising physician I was immediately available for consultation/collaboration.  Timothey Dahlstrom T Aika Brzoska, MD 02/15/13 0526 

## 2013-02-16 LAB — URINE CULTURE: Colony Count: >=100000

## 2013-02-17 ENCOUNTER — Telehealth (HOSPITAL_COMMUNITY): Payer: Self-pay | Admitting: Emergency Medicine

## 2013-02-17 NOTE — ED Notes (Signed)
Post ED Visit - Positive Culture Follow-up  Culture report reviewed by antimicrobial stewardship pharmacist: []  Wes Dulaney, Pharm.D., BCPS []  Celedonio Miyamoto, [address].D., BCPS [x]  Georgina Pillion, Pharm.D., BCPS []  [place], [address].D., BCPS, AAHIVP []  Estella Husk, Pharm.D., BCPS, AAHIVP  Positive urine culture Treated with Sulfa-Trimeth, organism sensitive to the same and no further patient follow-up is required at this time.  Kylie A Holland 02/17/2013, 10:53 AM

## 2014-01-13 ENCOUNTER — Encounter (HOSPITAL_COMMUNITY): Payer: Self-pay | Admitting: Emergency Medicine

## 2014-04-29 ENCOUNTER — Ambulatory Visit (INDEPENDENT_AMBULATORY_CARE_PROVIDER_SITE_OTHER): Payer: Self-pay | Admitting: Emergency Medicine

## 2014-04-29 VITALS — BP 118/76 | HR 82 | Temp 97.5°F | Ht 64.5 in | Wt 125.4 lb

## 2014-04-29 DIAGNOSIS — K130 Diseases of lips: Secondary | ICD-10-CM

## 2014-04-29 NOTE — Progress Notes (Signed)
[facility] [address], [place] [id number] [phone]  Date:  04/29/2014   Name:  Alexis Henderson   DOB:  12/03/1990   MRN:  [id number]  PCP:  Janine LimboSTRINGER,ARTHUR V, MD    Chief Complaint: Mouth Lesions   History of Present Illness:  Alexis Henderson is a 24 y.o. very pleasant female patient who presents with the following:  No history of injury.   Has a one month history of swollen mass on left lower lip mucosal surface. No oral symptoms.  No fever or chills No improvement with over the counter medications or other home remedies.  Denies other complaint or health concern today.   Patient Active Problem List   Diagnosis Date Noted  . Anemia, postpartum 04/27/2011    Past Medical History  Diagnosis Date  . No pertinent past medical history     Past Surgical History  Procedure Laterality Date  . No past surgeries      History  Substance Use Topics  . Smoking status: Never Smoker   . Smokeless tobacco: Never Used  . Alcohol Use: Yes     Comment: social    Family History  Problem Relation Age of Onset  . Diabetes Father     No Known Allergies  Medication list has been reviewed and updated.  No current outpatient prescriptions on file prior to visit.   No current facility-administered medications on file prior to visit.    Review of Systems:  As per HPI, otherwise negative.    Physical Examination: Filed Vitals:   04/29/14 1858  BP: 118/76  Pulse: 82  Temp: 97.5 F (36.4 C)   Filed Vitals:   04/29/14 1858  Height: 5' 4.5" (1.638 m)  Weight: 125 lb 6 oz (56.87 kg)   Body mass index is 21.2 kg/(m^2). Ideal Body Weight: Weight in (lb) to have BMI = 25: 147.6   GEN: WDWN, NAD, Non-toxic, Alert & Oriented x 3 HEENT: Atraumatic, Normocephalic.  Ears and Nose: No external deformity. EXTR: No clubbing/cyanosis/edema NEURO: Normal gait.  PSYCH: Normally interactive. Conversant. Not depressed or anxious appearing.  Calm  demeanor.  Lower lip has a 1 cm diameter tender cystic mass on mucosal surface.  Assessment and Plan: Mucosal labial cyst. I&D  Signed,  Phillips OdorJeffery Hue Steveson, MD

## 2014-10-06 ENCOUNTER — Ambulatory Visit (INDEPENDENT_AMBULATORY_CARE_PROVIDER_SITE_OTHER): Payer: Self-pay | Admitting: Physician Assistant

## 2014-10-06 VITALS — BP 118/76 | HR 94 | Temp 98.2°F | Resp 16 | Ht 63.5 in | Wt 124.8 lb

## 2014-10-06 DIAGNOSIS — N760 Acute vaginitis: Secondary | ICD-10-CM

## 2014-10-06 DIAGNOSIS — B9689 Other specified bacterial agents as the cause of diseases classified elsewhere: Secondary | ICD-10-CM

## 2014-10-06 DIAGNOSIS — N898 Other specified noninflammatory disorders of vagina: Secondary | ICD-10-CM

## 2014-10-06 DIAGNOSIS — A499 Bacterial infection, unspecified: Secondary | ICD-10-CM

## 2014-10-06 LAB — POCT WET PREP WITH KOH
KOH PREP POC: NEGATIVE
TRICHOMONAS UA: NEGATIVE
Yeast Wet Prep HPF POC: NEGATIVE

## 2014-10-06 LAB — POCT URINE PREGNANCY: PREG TEST UR: NEGATIVE

## 2014-10-06 MED ORDER — METRONIDAZOLE 500 MG PO TABS: 500.0000 mg | ORAL_TABLET | Freq: Two times a day (BID) | ORAL | Status: DC

## 2014-10-06 NOTE — Progress Notes (Signed)
10/06/2014 at 8:08 PM  Alexis Henderson / DOB: Aug 27, 1990 / MRN: [id number]  The patient has Anemia, postpartum(648.24) on her problem list.  SUBJECTIVE  Chief complaint: Vaginal Discharge and Depression  Alexis Henderson is a well appearing AA female with chief complaint of malodorous vaginal discharge.  Her symptoms started 4 days ago, and she has tried [employer] without any improvement.  She denies pregnancy and new partners at this time.  She is screened for STI every three months with her last check in March and reports she was negative.  She declines to be checked tonight.    She  has a past medical history of No pertinent past medical history.    Medications reviewed and updated by myself where necessary, and exist elsewhere in the encounter.   Alexis Henderson has No Known Allergies. She  reports that she has never smoked. She has never used smokeless tobacco. She reports that she drinks alcohol. She reports that she does not use illicit drugs. She  reports that she currently engages in sexual activity. She reports using the following method of birth control/protection: Other-see comments. The patient  has past surgical history that includes No past surgeries.  Her family history includes Diabetes in her father.  Review of Systems  Constitutional: Negative for fever and chills.  Respiratory: Negative for cough.   Cardiovascular: Negative for chest pain.  Gastrointestinal: Negative for nausea.  Genitourinary: Negative for dysuria.  Skin: Negative for itching and rash.  Neurological: Negative for dizziness and headaches.   Depression screen [facility] 2/9 10/06/2014  Decreased Interest 0  Down, Depressed, Hopeless 2  PHQ - 2 Score 2  Altered sleeping 0  Tired, decreased energy 0  Change in appetite 0  Feeling bad or failure about yourself  0  Trouble concentrating 0  Moving slowly or fidgety/restless 0  Suicidal thoughts 0  PHQ-9 Score 2      OBJECTIVE  Her  height is 5' 3.5" (1.613  m) and weight is 124 lb 12.8 oz (56.609 kg). Her oral temperature is 98.2 F (36.8 C). Her blood pressure is 118/76 and her pulse is 94. Her respiration is 16 and oxygen saturation is 98%.  The patient's body mass index is 21.76 kg/(m^2).  Physical Exam  Constitutional: She is oriented to person, place, and time. She appears well-developed and well-nourished. No distress.  Neck: Normal range of motion.  Respiratory: Effort normal and breath sounds normal.  GI: Soft. She exhibits no distension and no mass. There is no tenderness. There is no rebound and no guarding.  Neurological: She is alert and oriented to person, place, and time.  Skin: Skin is warm and dry. She is not diaphoretic.  Psychiatric: She has a normal mood and affect. Her behavior is normal.    Results for orders placed or performed in visit on 10/06/14 (from the past 24 hour(s))  POCT Wet Prep with KOH     Status: Abnormal   Collection Time: 10/06/14  8:07 PM  Result Value Ref Range   Trichomonas, UA Negative    Clue Cells Wet Prep HPF POC 2-4    Epithelial Wet Prep HPF POC Moderate Few, Moderate, Many   Yeast Wet Prep HPF POC neg    Bacteria Wet Prep HPF POC Moderate (A) None, Few   RBC Wet Prep HPF POC 0-1    WBC Wet Prep HPF POC 3-5    KOH Prep POC Negative   POCT urine pregnancy  Status: None   Collection Time: 10/06/14  8:07 PM  Result Value Ref Range   Preg Test, Ur Negative Negative    ASSESSMENT & PLAN  Alexis Henderson was seen today for vaginal discharge and depression.  Diagnoses and all orders for this visit:  Vaginal discharge Orders: -     POCT Wet Prep with KOH -     POCT urine pregnancy    The patient was advised to call or come back to clinic if she does not see an improvement in symptoms, or worsens with the above plan.   Alexis Henderson, MHS, PA-C [facility] 10/06/2014 8:08 PM

## 2014-11-30 ENCOUNTER — Other Ambulatory Visit: Payer: Self-pay | Admitting: Physician Assistant

## 2017-01-05 LAB — OB RESULTS CONSOLE HIV ANTIBODY (ROUTINE TESTING): HIV: NONREACTIVE

## 2017-01-05 LAB — OB RESULTS CONSOLE GC/CHLAMYDIA
CHLAMYDIA, DNA PROBE: NEGATIVE
GC PROBE AMP, GENITAL: NEGATIVE

## 2017-01-05 LAB — OB RESULTS CONSOLE RPR: RPR: NONREACTIVE

## 2017-01-05 LAB — OB RESULTS CONSOLE RUBELLA ANTIBODY, IGM: RUBELLA: IMMUNE

## 2017-01-05 LAB — OB RESULTS CONSOLE HEPATITIS B SURFACE ANTIGEN: Hepatitis B Surface Ag: NEGATIVE

## 2017-03-14 NOTE — L&D Delivery Note (Signed)
Delivery Note At  a viable female was delivered via  (Presentation:vtx ; roa ).  APGAR: ,8 ; weight pending  .  Vacuum applied at outlet. Applied x 1 compound presentation.  Dr. Charlotta Newton and Dr. Macon Large.  Infant to warmer, nicu present, spontaneous cry. Placenta status:complete , . 3V Cord:  with the following complications:velamentous insertion .    Anesthesia:  Epidural Episiotomy:  None Lacerations:  None Suture Repair: N/A Est. Blood Loss (mL):100cc    Mom to postpartum.  Baby to Couplet care / Skin to Skin.  Alexis Alexis 08/04/2017, 6:54 AM

## 2017-07-05 LAB — OB RESULTS CONSOLE GBS: STREP GROUP B AG: POSITIVE

## 2017-08-04 ENCOUNTER — Other Ambulatory Visit: Payer: Self-pay

## 2017-08-04 ENCOUNTER — Inpatient Hospital Stay (HOSPITAL_COMMUNITY): Payer: Medicaid Other | Admitting: Anesthesiology

## 2017-08-04 ENCOUNTER — Encounter (HOSPITAL_COMMUNITY): Payer: Self-pay | Admitting: *Deleted

## 2017-08-04 ENCOUNTER — Inpatient Hospital Stay (HOSPITAL_COMMUNITY)
Admission: AD | Admit: 2017-08-04 | Discharge: 2017-08-06 | DRG: 807 | Disposition: A | Discharge status: Home / Self Care | Payer: Medicaid Other | Source: Ambulatory Visit | Attending: Obstetrics and Gynecology | Admitting: Obstetrics and Gynecology

## 2017-08-04 DIAGNOSIS — Z3483 Encounter for supervision of other normal pregnancy, third trimester: Secondary | ICD-10-CM | POA: Diagnosis present

## 2017-08-04 DIAGNOSIS — O99824 Streptococcus B carrier state complicating childbirth: Secondary | ICD-10-CM | POA: Diagnosis present

## 2017-08-04 DIAGNOSIS — Z3A39 39 weeks gestation of pregnancy: Secondary | ICD-10-CM

## 2017-08-04 DIAGNOSIS — O43123 Velamentous insertion of umbilical cord, third trimester: Secondary | ICD-10-CM | POA: Diagnosis present

## 2017-08-04 DIAGNOSIS — O326XX0 Maternal care for compound presentation, not applicable or unspecified: Secondary | ICD-10-CM | POA: Diagnosis present

## 2017-08-04 LAB — CBC
HCT: 37.7 % (ref 36.0–46.0)
HEMOGLOBIN: 13.1 g/dL (ref 12.0–15.0)
MCH: 32.7 pg (ref 26.0–34.0)
MCHC: 34.7 g/dL (ref 30.0–36.0)
MCV: 94 fL (ref 78.0–100.0)
Platelets: 205 10*3/uL (ref 150–400)
RBC: 4.01 MIL/uL (ref 3.87–5.11)
RDW: 13.2 % (ref 11.5–15.5)
WBC: 8.4 10*3/uL (ref 4.0–10.5)

## 2017-08-04 LAB — TYPE AND SCREEN
ABO/RH(D): O POS
Antibody Screen: NEGATIVE

## 2017-08-04 LAB — RPR: RPR Ser Ql: NONREACTIVE

## 2017-08-04 LAB — POCT FERN TEST: POCT Fern Test: POSITIVE

## 2017-08-04 MED ORDER — ACETAMINOPHEN 325 MG PO TABS: 650.0000 mg | ORAL_TABLET | ORAL | Status: DC | PRN

## 2017-08-04 MED ORDER — TERBUTALINE SULFATE 1 MG/ML IJ SOLN
INTRAMUSCULAR | Status: AC
  Filled 2017-08-04: qty 1

## 2017-08-04 MED ORDER — TETANUS-DIPHTH-ACELL PERTUSSIS 5-2.5-18.5 LF-MCG/0.5 IM SUSP: 0.5000 mL | Freq: Once | INTRAMUSCULAR | Status: DC

## 2017-08-04 MED ORDER — LACTATED RINGERS IV SOLN: 500.0000 mL | INTRAVENOUS | Status: DC | PRN

## 2017-08-04 MED ORDER — FENTANYL 2.5 MCG/ML BUPIVACAINE 1/10 % EPIDURAL INFUSION (WH - ANES)
14.0000 mL/h | INTRAMUSCULAR | Status: DC | PRN
  Administered 2017-08-04: 14 mL/h via EPIDURAL
  Filled 2017-08-04: qty 100

## 2017-08-04 MED ORDER — DIPHENHYDRAMINE HCL 50 MG/ML IJ SOLN: 12.5000 mg | INTRAMUSCULAR | Status: DC | PRN

## 2017-08-04 MED ORDER — ONDANSETRON HCL 4 MG/2ML IJ SOLN: 4.0000 mg | Freq: Four times a day (QID) | INTRAMUSCULAR | Status: DC | PRN

## 2017-08-04 MED ORDER — SENNOSIDES-DOCUSATE SODIUM 8.6-50 MG PO TABS
2.0000 | ORAL_TABLET | ORAL | Status: DC
  Administered 2017-08-05 (×2): 2 via ORAL
  Filled 2017-08-04 (×2): qty 2

## 2017-08-04 MED ORDER — SODIUM CHLORIDE 0.9 % IV SOLN
5.0000 10*6.[IU] | Freq: Once | INTRAVENOUS | Status: AC
  Administered 2017-08-04: 5 10*6.[IU] via INTRAVENOUS
  Filled 2017-08-04: qty 5

## 2017-08-04 MED ORDER — PRENATAL MULTIVITAMIN CH
1.0000 | ORAL_TABLET | Freq: Every day | ORAL | Status: DC
  Administered 2017-08-04 – 2017-08-05 (×2): 1 via ORAL
  Filled 2017-08-04 (×2): qty 1

## 2017-08-04 MED ORDER — LACTATED RINGERS IV SOLN: 500.0000 mL | Freq: Once | INTRAVENOUS | Status: DC

## 2017-08-04 MED ORDER — IBUPROFEN 600 MG PO TABS
600.0000 mg | ORAL_TABLET | Freq: Four times a day (QID) | ORAL | Status: DC
  Administered 2017-08-04 – 2017-08-06 (×8): 600 mg via ORAL
  Filled 2017-08-04 (×8): qty 1

## 2017-08-04 MED ORDER — SIMETHICONE 80 MG PO CHEW: 80.0000 mg | CHEWABLE_TABLET | ORAL | Status: DC | PRN

## 2017-08-04 MED ORDER — LIDOCAINE HCL (PF) 1 % IJ SOLN
30.0000 mL | INTRAMUSCULAR | Status: DC | PRN
  Filled 2017-08-04: qty 30

## 2017-08-04 MED ORDER — ONDANSETRON HCL 4 MG/2ML IJ SOLN: 4.0000 mg | INTRAMUSCULAR | Status: DC | PRN

## 2017-08-04 MED ORDER — BENZOCAINE-MENTHOL 20-0.5 % EX AERO
1.0000 "application " | INHALATION_SPRAY | CUTANEOUS | Status: DC | PRN
  Filled 2017-08-04: qty 56

## 2017-08-04 MED ORDER — PENICILLIN G POT IN DEXTROSE 60000 UNIT/ML IV SOLN
3.0000 10*6.[IU] | INTRAVENOUS | Status: DC
  Filled 2017-08-04 (×2): qty 50

## 2017-08-04 MED ORDER — SOD CITRATE-CITRIC ACID 500-334 MG/5ML PO SOLN: 30.0000 mL | ORAL | Status: DC | PRN

## 2017-08-04 MED ORDER — WITCH HAZEL-GLYCERIN EX PADS: 1.0000 "application " | MEDICATED_PAD | CUTANEOUS | Status: DC | PRN

## 2017-08-04 MED ORDER — OXYTOCIN 40 UNITS IN LACTATED RINGERS INFUSION - SIMPLE MED
2.5000 [IU]/h | INTRAVENOUS | Status: DC
  Administered 2017-08-04: 2.5 [IU]/h via INTRAVENOUS
  Filled 2017-08-04: qty 1000

## 2017-08-04 MED ORDER — PHENYLEPHRINE 40 MCG/ML (10ML) SYRINGE FOR IV PUSH (FOR BLOOD PRESSURE SUPPORT)
80.0000 ug | PREFILLED_SYRINGE | INTRAVENOUS | Status: DC | PRN
  Filled 2017-08-04: qty 10

## 2017-08-04 MED ORDER — DIBUCAINE 1 % RE OINT
1.0000 "application " | TOPICAL_OINTMENT | RECTAL | Status: DC | PRN
  Filled 2017-08-04: qty 28

## 2017-08-04 MED ORDER — EPHEDRINE 5 MG/ML INJ
10.0000 mg | INTRAVENOUS | Status: DC | PRN
  Filled 2017-08-04: qty 4

## 2017-08-04 MED ORDER — OXYCODONE-ACETAMINOPHEN 5-325 MG PO TABS: 1.0000 | ORAL_TABLET | ORAL | Status: DC | PRN

## 2017-08-04 MED ORDER — DIPHENHYDRAMINE HCL 25 MG PO CAPS: 25.0000 mg | ORAL_CAPSULE | Freq: Four times a day (QID) | ORAL | Status: DC | PRN

## 2017-08-04 MED ORDER — PHENYLEPHRINE 40 MCG/ML (10ML) SYRINGE FOR IV PUSH (FOR BLOOD PRESSURE SUPPORT)
80.0000 ug | PREFILLED_SYRINGE | INTRAVENOUS | Status: DC | PRN
  Administered 2017-08-04 (×2): 80 ug via INTRAVENOUS

## 2017-08-04 MED ORDER — ZOLPIDEM TARTRATE 5 MG PO TABS: 5.0000 mg | ORAL_TABLET | Freq: Every evening | ORAL | Status: DC | PRN

## 2017-08-04 MED ORDER — OXYCODONE-ACETAMINOPHEN 5-325 MG PO TABS: 2.0000 | ORAL_TABLET | ORAL | Status: DC | PRN

## 2017-08-04 MED ORDER — ONDANSETRON HCL 4 MG PO TABS: 4.0000 mg | ORAL_TABLET | ORAL | Status: DC | PRN

## 2017-08-04 MED ORDER — LACTATED RINGERS IV SOLN
INTRAVENOUS | Status: DC
  Administered 2017-08-04: 05:00:00 via INTRAVENOUS

## 2017-08-04 MED ORDER — COCONUT OIL OIL
1.0000 "application " | TOPICAL_OIL | Status: DC | PRN
  Filled 2017-08-04: qty 120

## 2017-08-04 MED ORDER — OXYTOCIN BOLUS FROM INFUSION
500.0000 mL | Freq: Once | INTRAVENOUS | Status: AC
  Administered 2017-08-04: 500 mL via INTRAVENOUS

## 2017-08-04 MED ORDER — LIDOCAINE HCL (PF) 1 % IJ SOLN
INTRAMUSCULAR | Status: DC | PRN
  Administered 2017-08-04: 8 mL via EPIDURAL

## 2017-08-04 NOTE — MAU Note (Signed)
PT SAYS SHE THINKS SROM  WHEN SHE GOT INTO CAR .   UC STRONG  SINCE .   VE IN OFFICE TODAY- CLOSED.  DENIES HSV AND MRSA.  GBS- POSITIVE

## 2017-08-04 NOTE — Consult Note (Signed)
Neonatology Note:   Attendance at Delivery:    I was asked by Kenney Houseman, CNM to attend this vaginal delivery due to vacuum assist and fetal intolerance to labor. The mother is a G2P1, GBS + with one dose PCN <4h with good prenatal care. ROM 5 hours before precipitous delivery, fluid clear.  Our team arrived at 1-2 minutes of life.  Baby had received short course PPV and was currently being given BBO2.  SAO2 placed and in 70s. HR >100. Cry, color and tone improving. Bulb suctioned small amount of fluid from nares.  Continued BBO2 for at which time SAo2 in 90s; it remained stable on RA with no wob.   Ap 5 (assigned by L&D)/8 (-1 tone and color). Lungs clear to ausc in DR. To CN to care of Pediatrician.  Dineen Kid Leary Roca, MD

## 2017-08-04 NOTE — Anesthesia Postprocedure Evaluation (Signed)
Anesthesia Post Note  Patient: Alexis Henderson  Procedure(s) Performed: AN AD HOC LABOR EPIDURAL     Patient location during evaluation: Mother Baby Anesthesia Type: Epidural Level of consciousness: awake Pain management: pain level controlled Vital Signs Assessment: post-procedure vital signs reviewed and stable Respiratory status: spontaneous breathing Cardiovascular status: stable Postop Assessment: epidural receding and patient able to bend at knees Anesthetic complications: no    Last Vitals:  Vitals:   08/04/17 0941 08/04/17 1406  BP: 118/73 110/75  Pulse: 86 79  Resp: 16 18  Temp: 37.2 C 36.9 C  SpO2:      Last Pain:  Vitals:   08/04/17 1406  TempSrc: Oral  PainSc: 0-No pain   Pain Goal:                 Edison Pace

## 2017-08-04 NOTE — Anesthesia Preprocedure Evaluation (Signed)

## 2017-08-04 NOTE — Lactation Note (Signed)
This note was copied from a baby's chart. Lactation Consultation Note  Patient Name: Alexis Henderson [id number] Date: 08/04/2017 Reason for consult: Initial assessment;Term Breastfeeding consultation services and support information given to patient.  Baby is 7 hours old.  Mom states baby has been sleepy at breast.  Blankets removed and baby placed skin to skin in cradle hold.  Instructed to wait for wide open mouth before bringing baby to breast.  Baby latched well after a few attempts.  Observed active suck/swallows with some stimulation needed.  Instructed to feed with cues and call for assist prn.  Maternal Data Has patient been taught Hand Expression?: Yes Does the patient have breastfeeding experience prior to this delivery?: Yes  Feeding Feeding Type: Breast Fed Length of feed: 10 min  LATCH Score Latch: Grasps breast easily, tongue down, lips flanged, rhythmical sucking.  Audible Swallowing: A few with stimulation  Type of Nipple: Everted at rest and after stimulation  Comfort (Breast/Nipple): Soft / non-tender  Hold (Positioning): Assistance needed to correctly position infant at breast and maintain latch.  LATCH Score: 8  Interventions Interventions: Breast feeding basics reviewed;Assisted with latch;Breast compression;Skin to skin;Adjust position;Breast massage;Support pillows  Lactation Tools Discussed/Used     Consult Status Consult Status: Follow-up Date: 08/05/17 Follow-up type: In-patient    Huston Foley 08/04/2017, 1:41 PM
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
## 2017-08-04 NOTE — Anesthesia Postprocedure Evaluation (Signed)
Anesthesia Post Note  Patient: Alexis Henderson  Procedure(s) Performed: AN AD HOC LABOR EPIDURAL     Patient location during evaluation: Mother Baby Anesthesia Type: Epidural Level of consciousness: awake and alert Pain management: pain level controlled Vital Signs Assessment: post-procedure vital signs reviewed and stable Respiratory status: spontaneous breathing, nonlabored ventilation and respiratory function stable Cardiovascular status: stable Postop Assessment: no headache, no backache and epidural receding Anesthetic complications: no    Last Vitals:  Vitals:   08/04/17 0651 08/04/17 0700  BP: 109/71 126/60  Pulse: 98 (!) 102  Resp:    Temp:      Last Pain:  Vitals:   08/04/17 0427  TempSrc:   PainSc: 7    Pain Goal:                 Meganne Rita

## 2017-08-04 NOTE — Progress Notes (Signed)
Subjective: Pt comfortable with epidural after two boluses.  Called to room by RN prolonged decel.    Objective: BP 108/65   Pulse 87   Temp 98.3 F (36.8 C) (Oral)   Resp 16   Ht  (1.6 m)   Wt 68.4 kg (150 lb 12 oz)   BMI 26.70 kg/m  No intake/output data recorded. No intake/output data recorded.  FHT: Category 2  Prolong decel to 770 for 4 minutes.  Variability present.  FHT return to BL after ephedrine and position change.  FHT BL 120 with variable decel with contractions to 100.   UC:   5 in 10 minutes SVE:   Dilation: 4.5 Effacement (%): 90 Station: -2 Exam by:: Sherrell Puller Assessment:  G2P1001at 39.5 IUP in active labor Cat 2 strip GBS positive.  Plan: Monitor fetal status Anticipate SVD  Kenney Houseman CNM, MSN 08/04/2017, 5:12 AM

## 2017-08-04 NOTE — Anesthesia Procedure Notes (Signed)
Epidural Patient location during procedure: OB Start time: 08/04/2017 3:50 AM End time: 08/04/2017 3:55 AM  Staffing Anesthesiologist: Bethena Midget, MD  Preanesthetic Checklist Completed: patient identified, site marked, surgical consent, pre-op evaluation, timeout performed, IV checked, risks and benefits discussed and monitors and equipment checked  Epidural Patient position: sitting Prep: site prepped and draped and DuraPrep Patient monitoring: continuous pulse ox and blood pressure Approach: midline Location: L3-L4 Injection technique: LOR air  Needle:  Needle type: Tuohy  Needle gauge: 17 G Needle length: 9 cm and 9 Needle insertion depth: 6 cm Catheter type: closed end flexible Catheter size: 19 Gauge Catheter at skin depth: 11 cm Test dose: negative  Assessment Events: blood not aspirated, injection not painful, no injection resistance, negative IV test and no paresthesia

## 2017-08-05 LAB — CBC
HEMATOCRIT: 33.6 % — AB (ref 36.0–46.0)
HEMOGLOBIN: 11.3 g/dL — AB (ref 12.0–15.0)
MCH: 32.1 pg (ref 26.0–34.0)
MCHC: 33.6 g/dL (ref 30.0–36.0)
MCV: 95.5 fL (ref 78.0–100.0)
PLATELETS: 175 10*3/uL (ref 150–400)
RBC: 3.52 MIL/uL — ABNORMAL LOW (ref 3.87–5.11)
RDW: 13.4 % (ref 11.5–15.5)
WBC: 10.9 10*3/uL — ABNORMAL HIGH (ref 4.0–10.5)

## 2017-08-05 NOTE — Lactation Note (Signed)
This note was copied from a baby's chart. Lactation Consultation Note  Patient Name: Girl Domini Vandehei [id number] Date: 08/05/2017  Mom states baby is feeding well.  No concerns or questions.  Encouraged to call for assist prn.   Maternal Data    Feeding    LATCH Score                   Interventions    Lactation Tools Discussed/Used     Consult Status      Huston Foley 08/05/2017, 2:48 PM

## 2017-08-05 NOTE — Progress Notes (Signed)
Post Partum Day 1 Subjective: no complaints, up ad lib, voiding and tolerating PO  Objective: Blood pressure 106/76, pulse 90, temperature 98.1 F (36.7 C), temperature source Oral, resp. rate 18, height  (1.6 m), weight 68.4 kg (150 lb 12 oz), SpO2 99 %, unknown if currently breastfeeding.  Physical Exam:  General: alert, cooperative and no distress Lochia: appropriate Uterine Fundus: firm Incision: NA DVT Evaluation: No evidence of DVT seen on physical exam.  Recent Labs    08/04/17 0313 08/05/17 0602  HGB 13.1 11.3*  HCT 37.7 33.6*    Assessment/Plan: Plan for discharge tomorrow and Breastfeeding  Routine postpartum care   LOS: 1 day   Obie Kallenbach J. 08/05/2017, 9:44 AM

## 2017-08-05 NOTE — Plan of Care (Signed)
Patient is doing well.  She is not experiencing any complications.  Her pain is well controlled, bleeding is appropriate and is voiding without complications.  Patient has not concerns at this time.

## 2017-08-06 MED ORDER — IBUPROFEN 600 MG PO TABS: 600.0000 mg | ORAL_TABLET | Freq: Four times a day (QID) | ORAL | 0 refills | Status: DC

## 2017-08-06 NOTE — Discharge Summary (Signed)
OB Discharge Summary     Patient Name: Alexis Henderson DOB: May 21, 1990 MRN: [id number]  Date of admission: 08/04/2017 Delivering MD: Kenney Houseman   Date of discharge: 08/06/2017  Admitting diagnosis: 39WKS CTX Intrauterine pregnancy: [redacted]w[redacted]d     Secondary diagnosis:  Active Problems:   Labor and delivery complicated by meconium in amniotic fluid, antepartum   Vacuum-assisted cesarean delivery, delivered, current hospitalization  Additional problems: None     Discharge diagnosis: Term Pregnancy Delivered                                                                                                Post partum procedures:None  Augmentation: None  Complications: None  Hospital course:  Onset of Labor With Vaginal Delivery     27 y.o. yo [id number] at [redacted]w[redacted]d was admitted in Active Labor on 08/04/2017. Patient had an uncomplicated labor course as follows:  Membrane Rupture Time/Date:   ,    Intrapartum Procedures: Episiotomy: None [1]                                         Lacerations:  None [1]  Patient had a delivery of a Viable infant. 08/04/2017  Information for the patient's newborn:  Joeseph Amor Dream [[id number]]  Delivery Method: Vaginal, Vacuum (Extractor)(Filed from Delivery Summary)    Pateint had an uncomplicated postpartum course.  She is ambulating, tolerating a regular diet, passing flatus, and urinating well. Patient is discharged home in stable condition on 08/10/17.   Physical exam  Vitals:   08/04/17 1406 08/05/17 0500 08/05/17 1731 08/06/17 0524  BP: 110/75 106/76 120/74 108/68  Pulse: 79 90 92 75  Resp: Temp: 98.5 F (36.9 C) 98.1 F (36.7 C) 98.4 F (36.9 C) 97.7 F (36.5 C)  TempSrc: Oral Oral Oral Oral  SpO2:  99%    Weight:      Height:       General: alert, cooperative and no distress Lochia: appropriate Uterine Fundus: firm Incision: N/A DVT Evaluation: No evidence of DVT seen on physical exam. Negative Homan's  sign. Labs: Lab Results  Component Value Date   WBC 10.9 (H) 08/05/2017   HGB 11.3 (L) 08/05/2017   HCT 33.6 (L) 08/05/2017   MCV 95.5 08/05/2017   PLT 175 08/05/2017   CMP Latest Ref Rng & Units 02/13/2013  Glucose 70 - 99 mg/dL 130(Q)  BUN 6 - 23 mg/dL 11  Creatinine 6.57 - 8.46 mg/dL 9.62  Sodium 952 - 841 mEq/L 132(L)  Potassium 3.5 - 5.1 mEq/L 3.2(L)  Chloride 96 - 112 mEq/L 96  CO2 19 - 32 mEq/L 22  Calcium 8.4 - 10.5 mg/dL 9.8    Discharge instruction: per After Visit Summary and "Baby and Me Booklet".  After visit meds:  Allergies as of 08/06/2017   No Known Allergies     Medication List    TAKE these medications   ibuprofen 600 MG tablet Commonly known as:  ADVIL,MOTRIN Take  1 tablet (600 mg total) by mouth every 6 (six) hours.   prenatal multivitamin Tabs tablet Take 1 tablet by mouth daily at 12 noon.       Diet: routine diet  Activity: Advance as tolerated. Pelvic rest for 6 weeks.   Outpatient follow up:6 weeks Follow up Appt:No future appointments. Follow up Visit:No follow-ups on file.  Postpartum contraception: Undecided  Newborn Data: Live born female  Birth Weight: 7 lb 11.5 oz (3500 g) APGAR: 5, 8  Newborn Delivery   Birth date/time:  08/04/2017 06:36:00 Delivery type:  Vaginal, Vacuum (Extractor)     Baby Feeding: Breast Disposition:home with mother   08/06/2017 Kenney Houseman, CNM

## 2017-08-06 NOTE — Plan of Care (Signed)
Progressing appropriately. Encouraged to call for assistance with feeding as needed, and for LATCH assessment. 

## 2017-08-14 ENCOUNTER — Inpatient Hospital Stay (HOSPITAL_COMMUNITY): Payer: Medicaid Other

## 2020-11-12 DIAGNOSIS — Z419 Encounter for procedure for purposes other than remedying health state, unspecified: Secondary | ICD-10-CM | POA: Diagnosis not present

## 2020-11-24 LAB — OB RESULTS CONSOLE ABO/RH: RH Type: POSITIVE

## 2020-11-24 LAB — OB RESULTS CONSOLE ANTIBODY SCREEN: Antibody Screen: NEGATIVE

## 2020-11-24 LAB — OB RESULTS CONSOLE HIV ANTIBODY (ROUTINE TESTING): HIV: NONREACTIVE

## 2020-11-24 LAB — HEPATITIS C ANTIBODY: HCV Ab: NEGATIVE

## 2020-11-24 LAB — OB RESULTS CONSOLE GC/CHLAMYDIA: Chlamydia: NEGATIVE

## 2020-11-24 LAB — OB RESULTS CONSOLE GBS: GBS: NEGATIVE

## 2020-11-24 LAB — OB RESULTS CONSOLE RUBELLA ANTIBODY, IGM: Rubella: IMMUNE

## 2020-11-24 LAB — OB RESULTS CONSOLE RPR: RPR: NONREACTIVE

## 2020-11-24 LAB — OB RESULTS CONSOLE HEPATITIS B SURFACE ANTIGEN: Hepatitis B Surface Ag: NEGATIVE

## 2020-12-12 DIAGNOSIS — Z419 Encounter for procedure for purposes other than remedying health state, unspecified: Secondary | ICD-10-CM | POA: Diagnosis not present

## 2021-01-12 DIAGNOSIS — Z419 Encounter for procedure for purposes other than remedying health state, unspecified: Secondary | ICD-10-CM | POA: Diagnosis not present

## 2021-02-11 DIAGNOSIS — Z419 Encounter for procedure for purposes other than remedying health state, unspecified: Secondary | ICD-10-CM | POA: Diagnosis not present

## 2021-03-14 DIAGNOSIS — Z419 Encounter for procedure for purposes other than remedying health state, unspecified: Secondary | ICD-10-CM | POA: Diagnosis not present

## 2021-03-14 NOTE — L&D Delivery Note (Signed)
Operative Delivery Note ? ?Patient progressed rapidly and after pushing less than 10 minutes after being noted to be C/C/+1. At 10:36 AM a viable and healthy female was delivered via Vaginal, Spontaneous.  Presentation: vertex; Position: Right,, Occiput,, Anterior; Station: +4.  The Peds team had been notified to be present at time of delivery during the maternal pushing as there were deep decelerations and thick meconium.  Once the head delivered, the Anterior shoulder did not immediately deliver with gentle downward traction and a shoulder dystocia was called. More emergency nursing staff arrived just prior to Peds arrival.  ? ?Simultaneous mcRoberts / suprapubic pressure was performed then Wood's Screw maneuver. The Shoulder was then delivered followed by the body w/o difficulty.  The baby had poor tone so the cord was quickly double clamped and cut and the infant placed on the warmer and was examined by Peds. The baby was noted to be moving all four extremities and there was no notable defect after the shoulder dystocia.  ? ?Cord gases, both venous and arterial were collected followed by cord blood for typing.  The placenta was then delivered intact spontaneously with trailing membranes.  3 vessel cord noted.   ? ?Uterine atony alleviated by IV pitocin and massage.  ?There were no lacerations noted upon inspection of the cervix, vagina and perineum.  ? ?Patient tolerated delivery well, there were no complications.  ? ? ? ? ?Delivery Details: ?Delivery of the head: 06/22/2021 10:35 AM ?First maneuver: 06/22/2021 10:35 AM, Suprapubic Pressure ?McRoberts ?Woods screw (fetal shoulder rotation) ?Second maneuver: ,   ? ? ?Verbal consent: obtained from patient. ? ?APGAR: 4, 9; weight pending .   ?Placenta status: intact normal appearance Cord:  3VC  ?Cord gases were not labeled correctly so there are two results for arterial gases: ?pH cord blood (arterial) 7.21 - 7.38 7.17 Low Panic   7.07 Low Panic  CM   ? ? ?Anesthesia:   Epidural ?Episiotomy: None ?Lacerations: None ?Suture Repair: N/A ?Est. Blood Loss (mL): 100 ? ?Mom to postpartum.  Baby to Couplet care / Skin to Skin. ? ?Essie Hart ?06/22/2021, 11:27 AM ? ? ?

## 2021-04-14 DIAGNOSIS — Z419 Encounter for procedure for purposes other than remedying health state, unspecified: Secondary | ICD-10-CM | POA: Diagnosis not present

## 2021-05-12 DIAGNOSIS — Z419 Encounter for procedure for purposes other than remedying health state, unspecified: Secondary | ICD-10-CM | POA: Diagnosis not present

## 2021-05-19 LAB — OB RESULTS CONSOLE GBS: GBS: NEGATIVE

## 2021-06-12 DIAGNOSIS — Z419 Encounter for procedure for purposes other than remedying health state, unspecified: Secondary | ICD-10-CM | POA: Diagnosis not present

## 2021-06-15 ENCOUNTER — Inpatient Hospital Stay (HOSPITAL_COMMUNITY)
Admission: AD | Admit: 2021-06-15 | Payer: Medicaid Other | Source: Home / Self Care | Admitting: Obstetrics & Gynecology

## 2021-06-15 ENCOUNTER — Other Ambulatory Visit: Payer: Self-pay | Admitting: Obstetrics & Gynecology

## 2021-06-16 ENCOUNTER — Encounter (HOSPITAL_COMMUNITY): Payer: Self-pay | Admitting: *Deleted

## 2021-06-16 ENCOUNTER — Telehealth (HOSPITAL_COMMUNITY): Payer: Self-pay | Admitting: *Deleted

## 2021-06-16 NOTE — Telephone Encounter (Signed)
Preadmission screen  

## 2021-06-18 ENCOUNTER — Telehealth (HOSPITAL_COMMUNITY): Payer: Self-pay | Admitting: *Deleted

## 2021-06-18 NOTE — Telephone Encounter (Signed)
Preadmission screen  

## 2021-06-21 ENCOUNTER — Telehealth (HOSPITAL_COMMUNITY): Payer: Self-pay | Admitting: *Deleted

## 2021-06-21 ENCOUNTER — Encounter (HOSPITAL_COMMUNITY): Payer: Self-pay | Admitting: *Deleted

## 2021-06-21 NOTE — Telephone Encounter (Signed)
Preadmission screen  

## 2021-06-22 ENCOUNTER — Other Ambulatory Visit: Payer: Self-pay

## 2021-06-22 ENCOUNTER — Inpatient Hospital Stay (HOSPITAL_COMMUNITY)
Admission: AD | Admit: 2021-06-22 | Discharge: 2021-06-24 | DRG: 806 | Disposition: A | Discharge status: Home / Self Care | Payer: BC Managed Care – PPO | Attending: Obstetrics & Gynecology | Admitting: Obstetrics & Gynecology

## 2021-06-22 ENCOUNTER — Inpatient Hospital Stay (HOSPITAL_COMMUNITY): Payer: BC Managed Care – PPO | Admitting: Anesthesiology

## 2021-06-22 ENCOUNTER — Encounter (HOSPITAL_COMMUNITY): Payer: Self-pay | Admitting: Obstetrics and Gynecology

## 2021-06-22 DIAGNOSIS — Z3A40 40 weeks gestation of pregnancy: Secondary | ICD-10-CM

## 2021-06-22 DIAGNOSIS — D62 Acute posthemorrhagic anemia: Secondary | ICD-10-CM | POA: Diagnosis not present

## 2021-06-22 DIAGNOSIS — O480 Post-term pregnancy: Secondary | ICD-10-CM | POA: Diagnosis not present

## 2021-06-22 DIAGNOSIS — O9081 Anemia of the puerperium: Secondary | ICD-10-CM | POA: Diagnosis not present

## 2021-06-22 LAB — CBC
HCT: 37.1 % (ref 36.0–46.0)
Hemoglobin: 12.8 g/dL (ref 12.0–15.0)
MCH: 32.4 pg (ref 26.0–34.0)
MCHC: 34.5 g/dL (ref 30.0–36.0)
MCV: 93.9 fL (ref 80.0–100.0)
Platelets: 209 10*3/uL (ref 150–400)
RBC: 3.95 MIL/uL (ref 3.87–5.11)
RDW: 13 % (ref 11.5–15.5)
WBC: 8.5 10*3/uL (ref 4.0–10.5)
nRBC: 0 % (ref 0.0–0.2)

## 2021-06-22 LAB — TYPE AND SCREEN
ABO/RH(D): O POS
Antibody Screen: NEGATIVE

## 2021-06-22 LAB — RPR: RPR Ser Ql: NONREACTIVE

## 2021-06-22 MED ORDER — ONDANSETRON HCL 4 MG/2ML IJ SOLN: 4.0000 mg | Freq: Four times a day (QID) | INTRAMUSCULAR | Status: DC | PRN

## 2021-06-22 MED ORDER — FLEET ENEMA 7-19 GM/118ML RE ENEM: 1.0000 | ENEMA | RECTAL | Status: DC | PRN

## 2021-06-22 MED ORDER — SOD CITRATE-CITRIC ACID 500-334 MG/5ML PO SOLN: 30.0000 mL | ORAL | Status: DC | PRN

## 2021-06-22 MED ORDER — DIBUCAINE (PERIANAL) 1 % EX OINT: 1.0000 "application " | TOPICAL_OINTMENT | CUTANEOUS | Status: DC | PRN

## 2021-06-22 MED ORDER — LIDOCAINE HCL (PF) 1 % IJ SOLN
INTRAMUSCULAR | Status: DC | PRN
Start: 2021-06-22 — End: 2021-06-22
  Administered 2021-06-22: 5 mL via EPIDURAL

## 2021-06-22 MED ORDER — OXYTOCIN-SODIUM CHLORIDE 30-0.9 UT/500ML-% IV SOLN
2.5000 [IU]/h | INTRAVENOUS | Status: DC
  Administered 2021-06-22: 2.5 [IU]/h via INTRAVENOUS
  Filled 2021-06-22: qty 500

## 2021-06-22 MED ORDER — SENNOSIDES-DOCUSATE SODIUM 8.6-50 MG PO TABS
2.0000 | ORAL_TABLET | Freq: Every day | ORAL | Status: DC
  Administered 2021-06-23 – 2021-06-24 (×2): 2 via ORAL
  Filled 2021-06-22 (×2): qty 2

## 2021-06-22 MED ORDER — DIPHENHYDRAMINE HCL 50 MG/ML IJ SOLN: 12.5000 mg | INTRAMUSCULAR | Status: DC | PRN

## 2021-06-22 MED ORDER — LACTATED RINGERS IV SOLN: INTRAVENOUS | Status: DC

## 2021-06-22 MED ORDER — SIMETHICONE 80 MG PO CHEW: 80.0000 mg | CHEWABLE_TABLET | ORAL | Status: DC | PRN

## 2021-06-22 MED ORDER — IBUPROFEN 600 MG PO TABS
600.0000 mg | ORAL_TABLET | Freq: Four times a day (QID) | ORAL | Status: DC
  Administered 2021-06-22 – 2021-06-24 (×8): 600 mg via ORAL
  Filled 2021-06-22 (×8): qty 1

## 2021-06-22 MED ORDER — FENTANYL-BUPIVACAINE-NACL 0.5-0.125-0.9 MG/250ML-% EP SOLN
EPIDURAL | Status: DC | PRN
  Administered 2021-06-22: 12 mL/h via EPIDURAL

## 2021-06-22 MED ORDER — WITCH HAZEL-GLYCERIN EX PADS: 1.0000 "application " | MEDICATED_PAD | CUTANEOUS | Status: DC | PRN

## 2021-06-22 MED ORDER — ACETAMINOPHEN 325 MG PO TABS: 650.0000 mg | ORAL_TABLET | ORAL | Status: DC | PRN

## 2021-06-22 MED ORDER — BENZOCAINE-MENTHOL 20-0.5 % EX AERO: 1.0000 "application " | INHALATION_SPRAY | CUTANEOUS | Status: DC | PRN

## 2021-06-22 MED ORDER — PHENYLEPHRINE 40 MCG/ML (10ML) SYRINGE FOR IV PUSH (FOR BLOOD PRESSURE SUPPORT): 80.0000 ug | PREFILLED_SYRINGE | INTRAVENOUS | Status: DC | PRN

## 2021-06-22 MED ORDER — LIDOCAINE HCL (PF) 1 % IJ SOLN: 30.0000 mL | INTRAMUSCULAR | Status: DC | PRN

## 2021-06-22 MED ORDER — ZOLPIDEM TARTRATE 5 MG PO TABS: 5.0000 mg | ORAL_TABLET | Freq: Every evening | ORAL | Status: DC | PRN

## 2021-06-22 MED ORDER — OXYTOCIN BOLUS FROM INFUSION
333.0000 mL | Freq: Once | INTRAVENOUS | Status: AC
  Administered 2021-06-22: 333 mL via INTRAVENOUS

## 2021-06-22 MED ORDER — LACTATED RINGERS IV SOLN
500.0000 mL | Freq: Once | INTRAVENOUS | Status: AC
  Administered 2021-06-22: 500 mL via INTRAVENOUS

## 2021-06-22 MED ORDER — PRENATAL MULTIVITAMIN CH
1.0000 | ORAL_TABLET | Freq: Every day | ORAL | Status: DC
  Administered 2021-06-22 – 2021-06-24 (×3): 1 via ORAL
  Filled 2021-06-22 (×3): qty 1

## 2021-06-22 MED ORDER — COCONUT OIL OIL: 1.0000 "application " | TOPICAL_OIL | Status: DC | PRN

## 2021-06-22 MED ORDER — DIPHENHYDRAMINE HCL 25 MG PO CAPS: 25.0000 mg | ORAL_CAPSULE | Freq: Four times a day (QID) | ORAL | Status: DC | PRN

## 2021-06-22 MED ORDER — ONDANSETRON HCL 4 MG PO TABS: 4.0000 mg | ORAL_TABLET | ORAL | Status: DC | PRN

## 2021-06-22 MED ORDER — OXYTOCIN-SODIUM CHLORIDE 30-0.9 UT/500ML-% IV SOLN: 2.5000 [IU]/h | INTRAVENOUS | Status: DC | PRN

## 2021-06-22 MED ORDER — OXYCODONE-ACETAMINOPHEN 5-325 MG PO TABS: 1.0000 | ORAL_TABLET | ORAL | Status: DC | PRN

## 2021-06-22 MED ORDER — EPHEDRINE 5 MG/ML INJ: 10.0000 mg | INTRAVENOUS | Status: DC | PRN

## 2021-06-22 MED ORDER — FENTANYL-BUPIVACAINE-NACL 0.5-0.125-0.9 MG/250ML-% EP SOLN: 12.0000 mL/h | EPIDURAL | Status: DC | PRN

## 2021-06-22 MED ORDER — TETANUS-DIPHTH-ACELL PERTUSSIS 5-2.5-18.5 LF-MCG/0.5 IM SUSY: 0.5000 mL | PREFILLED_SYRINGE | Freq: Once | INTRAMUSCULAR | Status: DC

## 2021-06-22 MED ORDER — OXYCODONE-ACETAMINOPHEN 5-325 MG PO TABS: 2.0000 | ORAL_TABLET | ORAL | Status: DC | PRN

## 2021-06-22 MED ORDER — LACTATED RINGERS IV SOLN: 500.0000 mL | INTRAVENOUS | Status: DC | PRN

## 2021-06-22 MED ORDER — ONDANSETRON HCL 4 MG/2ML IJ SOLN: 4.0000 mg | INTRAMUSCULAR | Status: DC | PRN

## 2021-06-22 MED ORDER — FENTANYL-BUPIVACAINE-NACL 0.5-0.125-0.9 MG/250ML-% EP SOLN
EPIDURAL | Status: AC
  Filled 2021-06-22: qty 250

## 2021-06-22 NOTE — H&P (Signed)
OB ADMISSION/ HISTORY & PHYSICAL: ? ?Admission Date: 06/22/2021  3:50 AM  ?Admit Diagnosis: Normal labor ? ?Alexis Henderson is a 31 y.o. female G3P2001 [redacted]w[redacted]d presenting for labor eval. Endorses active FM, denies LOF and vaginal bleeding. Ctx began @ 0230. Hx SVD in Jun 29, 2011 (child died at age 17 from an accidental drowning) and a vacuum-assisted birth in Jun 28, 2017 for decels.  ? ?History of current pregnancy: ?G3P2001   ?Patient entered care with [facility] at 10+6 wks.   ?EDC 06/16/2021 by 10+6 wk U/S.   ?Anatomy scan:  19+5 wks, complete w/ anterior placenta.   ?Lats eval: 33+6 wks vertex/ anterior placenta/ AFI 13.7/ EFW 5# 15 oz (87%) ? ?Patient Active Problem List  ? Diagnosis Date Noted  ? Normal labor 06/22/2021  ? ? ?Prenatal Labs: ?ABO, Rh: --/--/PENDING (04/11 [id number]) ?Antibody: PENDING (04/11 0436) ?Rubella: Immune (09/13 0000)  ?RPR: Nonreactive (09/13 0000)  ?HBsAg: Negative (09/13 0000)  ?HIV: Non-reactive (09/13 0000)  ?GTT: abnormal 1 hr, normal 3 hr ?GBS: Negative/-- (03/08 0000)  ?GC/CHL: neg/neg ?Genetics: low-risk  ?Tdap/influenza vaccines: declined both ? ? ?OB History  ?Gravida Para Term Preterm AB Living  ?3 2 2  0 0 1  ?SAB IAB Ectopic Multiple Live Births  ?0 0 0 0 2  ?  ?# Outcome Date GA Lbr Len/2nd Weight Sex Delivery Anes PTL Lv  ?3 Current           ?2 Term 08/04/17 [redacted]w[redacted]d 06:24 / 00:12 3500 g F Vag-Vacuum EPI  LIV  ?1 Term 04/25/11 101w5d 13:40 / 01:35 3062 g M Vag-Spont EPI  DEC  ? ? ?Medical / Surgical History: ?Past medical history:  ?Past Medical History:  ?Diagnosis Date  ? No pertinent past medical history   ?  ?Past surgical history:  ?Past Surgical History:  ?Procedure Laterality Date  ? NO PAST SURGERIES    ? ?Family History:  ?Family History  ?Problem Relation Age of Onset  ? Diabetes Mother   ?  ?Social History:  reports that she has never smoked. She has never used smokeless tobacco. She reports that she does not currently use alcohol. She reports that she does not use drugs. ? ?Allergies: ?Patient  has no known allergies. ?  ?Current Medications at time of admission:  ?Prior to Admission medications   ?Medication Sig Start Date End Date Taking? Authorizing Provider  ?cholecalciferol (VITAMIN D3) 25 MCG (1000 UNIT) tablet Take 1,000 Units by mouth daily.   Yes [provider]  ?Prenatal Vit-Fe Fumarate-FA (PRENATAL MULTIVITAMIN) TABS tablet Take 1 tablet by mouth daily at 12 noon.   Yes [provider]  ?ibuprofen (ADVIL,MOTRIN) 600 MG tablet Take 1 tablet (600 mg total) by mouth every 6 (six) hours. 08/06/17   Prothero, Pleas Koch, CNM  ? ? ?Review of Systems: ?Constitutional: Negative   ?HENT: Negative   ?Eyes: Negative   ?Respiratory: Negative   ?Cardiovascular: Negative   ?Gastrointestinal: Negative  ?Genitourinary: neg for bloody show, neg for LOF   ?Musculoskeletal: Negative   ?Skin: Negative   ?Neurological: Negative   ?Endo/Heme/Allergies: Negative   ?Psychiatric/Behavioral: Negative  ? ? ?Physical Exam: ?VS: Blood pressure 128/73, pulse 88, temperature 98.3 ?F (36.8 ?C), temperature source Oral, resp. rate 18, height 5\' 3"  (1.6 m), weight 68.8 kg, SpO2 100 %, unknown if currently breastfeeding. ?AAO x3, no signs of distress ?Cardiovascular: RRR ?Respiratory: Lung fields clear to ausculation ?GU/GI: Abdomen gravid, non-tender, non-distended, active FM, vertex ?Extremities: no edema, negative for pain, tenderness, and cords ? ?  Cervical exam:Dilation: 4 ?Effacement (%): 60 ?Station: -3 ?Exam by:: Victorino Dike, RN ?FHR: baseline rate 135 / variability moderate / accelerations present / early decelerations ?TOCO: 2-3 min ? ? ?Prenatal Transfer Tool  ?Maternal Diabetes: No ?Genetic Screening: Normal ?Maternal Ultrasounds/Referrals: Normal ?Fetal Ultrasounds or other Referrals:  None ?Maternal Substance Abuse:  No ?Significant Maternal Medications:  None ?Significant Maternal Lab Results: Group B Strep negative ? ? ? ?Assessment: ?31 y.o. G3P2001 [redacted]w[redacted]d ?Normal labor ? ?Latent stage of  labor ?FHR category 1 ?GBS neg ?Thick meconium ?Pain management plan: Epidural ? ? ?Plan:  ?Admit to L&D ?Routine admission orders ?Epidural PRN ?Expectant management ?Dr Alwyn Pea to be notified of admission and plan of care ? ?Arrie Eastern MSN, CNM ?06/22/2021 5:51 AM ? ?

## 2021-06-22 NOTE — Lactation Note (Signed)
This note was copied from a baby's chart. ?Lactation Consultation Note ?Experienced BF mom of 1 yr to her now 31 yr old daughter stated baby BF well earlier but she doesn't know if the baby got anything or not. ?Newborn feeding habits, STS, I&O, supply and demand reviewed. ?Hand expression reviewed demonstrating colostrum to Lt. Breast. Mom happy to see that.  ?Mom encouraged to feed baby 8-12 times/24 hours and with feeding cues.   ?Encouraged mom to call for [facility] assistance tonight for latch assistance. ? ?Patient Name: Alexis Henderson ?Today's Date: 06/22/2021 ?Reason for consult: Initial assessment;Term ?Age:35 hours ? ?Maternal Data ?Has patient been taught Hand Expression?: Yes ?Does the patient have breastfeeding experience prior to this delivery?: Yes ?How long did the patient breastfeed?: 1 yr ? ?Feeding ?  ? ?LATCH Score ?  ? ?  ? ?Type of Nipple: Everted at rest and after stimulation ? ?Comfort (Breast/Nipple): Soft / non-tender ? ?  ? ?  ? ? ?Lactation Tools Discussed/Used ?  ? ?Interventions ?Interventions: Breast feeding basics reviewed;Hand express ? ?Discharge ?  ? ?Consult Status ?Consult Status: Follow-up ?Date: 06/23/21 ?Follow-up type: In-patient ? ? ? ?Theodoro Kalata ?06/22/2021, 9:14 PM ? ? ? ?

## 2021-06-22 NOTE — Anesthesia Postprocedure Evaluation (Signed)
Anesthesia Post Note ? ?Patient: Alexis Henderson ? ?Procedure(s) Performed: AN AD HOC LABOR EPIDURAL ? ?  ? ?Patient location during evaluation: Mother Baby ?Anesthesia Type: Epidural ?Level of consciousness: awake and alert and oriented ?Pain management: satisfactory to patient ?Vital Signs Assessment: post-procedure vital signs reviewed and stable ?Respiratory status: respiratory function stable ?Cardiovascular status: stable ?Postop Assessment: no headache, no backache, epidural receding, patient able to bend at knees, no signs of nausea or vomiting, adequate PO intake and able to ambulate ?Anesthetic complications: no ? ? ?No notable events documented. ? ?Last Vitals:  ?Vitals:  ? 06/22/21 1233 06/22/21 1340  ?BP: 131/80 105/70  ?Pulse: 96 86  ?Resp: 16 16  ?Temp: (!) 36.4 ?C 36.8 ?C  ?SpO2: 100% 100%  ?  ?Last Pain:  ?Vitals:  ? 06/22/21 1340  ?TempSrc: Oral  ?PainSc:   ? ?Pain Goal: Patients Stated Pain Goal: 0 (06/22/21 0454) ? ?  ?  ?  ?  ?  ?  ?  ? ?Urian Martenson ? ? ? ? ?

## 2021-06-22 NOTE — Anesthesia Preprocedure Evaluation (Signed)
Anesthesia Evaluation  ?Patient identified by MRN, date of birth, ID band ?Patient awake ? ? ? ?Reviewed: ?Allergy & Precautions, NPO status , Patient's Chart, lab work & pertinent test results ? ?Airway ?Mallampati: II ? ?TM Distance: >3 FB ?Neck ROM: Full ? ? ? Dental ?no notable dental hx. ?(+) Teeth Intact, Dental Advisory Given ?  ?Pulmonary ? ?  ?Pulmonary exam normal ?breath sounds clear to auscultation ? ? ? ? ? ? Cardiovascular ?Exercise Tolerance: Good ?negative cardio ROS ?Normal cardiovascular exam ?Rhythm:Regular Rate:Normal ? ? ?  ?Neuro/Psych ?negative neurological ROS ?   ? GI/Hepatic ?Neg liver ROS,   ?Endo/Other  ?negative endocrine ROS ? Renal/GU ?negative Renal ROS  ? ?  ?Musculoskeletal ?negative musculoskeletal ROS ?(+)  ? Abdominal ?  ?Peds ? Hematology ?Lab Results ?     Component                Value               Date                 ?     WBC                      8.5                 06/22/2021           ?     HGB                      12.8                06/22/2021           ?     HCT                      37.1                06/22/2021           ?     MCV                      93.9                06/22/2021           ?     PLT                      209                 06/22/2021           ?   ?Anesthesia Other Findings ? ? Reproductive/Obstetrics ?(+) Pregnancy ? ?  ? ? ? ? ? ? ? ? ? ? ? ? ? ?  ?  ? ? ? ? ? ? ? ? ?Anesthesia Physical ?Anesthesia Plan ? ?ASA: 2 ? ?Anesthesia Plan: Epidural  ? ?Post-op Pain Management:   ? ?Induction:  ? ?PONV Risk Score and Plan:  ? ?Airway Management Planned:  ? ?Additional Equipment: None ? ?Intra-op Plan:  ? ?Post-operative Plan:  ? ?Informed Consent: I have reviewed the patients History and Physical, chart, labs and discussed the procedure including the risks, benefits and alternatives for the proposed anesthesia with the patient or authorized representative who has indicated his/her understanding and acceptance.   ? ? ? ? ? ?Plan Discussed with:  ? ?Anesthesia Plan  Comments: (40.6 wk G3P2)  ? ? ? ? ? ? ?Anesthesia Quick Evaluation ? ?

## 2021-06-22 NOTE — MAU Note (Signed)
..  Alexis Henderson is a 31 y.o. at [redacted]w[redacted]d here in MAU reporting: contractions that began around 2:50am that are now every 3 minutes. Denies leaking of fluid. +FM. Reports some bloody show.  ? ?Pain score: 8/10 ?Vitals:  ? 06/22/21 0408  ?BP: 117/73  ?Pulse: 97  ?Resp: 15  ?Temp: 98.2 ?F (36.8 ?C)  ?SpO2: 100%  ?   ?FHT:141 ?Lab orders placed from triage:  MAU labor eval ? ?

## 2021-06-22 NOTE — Anesthesia Procedure Notes (Signed)
Epidural ?Patient location during procedure: OB ?Start time: 06/22/2021 5:39 AM ?End time: 06/22/2021 5:52 AM ? ?Staffing ?Anesthesiologist: Trevor Iha, MD ?Performed: anesthesiologist  ? ?Preanesthetic Checklist ?Completed: patient identified, IV checked, site marked, risks and benefits discussed, surgical consent, monitors and equipment checked, pre-op evaluation and timeout performed ? ?Epidural ?Patient position: sitting ?Prep: DuraPrep and site prepped and draped ?Patient monitoring: continuous pulse ox and blood pressure ?Approach: midline ?Location: L3-L4 ?Injection technique: LOR air ? ?Needle:  ?Needle type: Tuohy  ?Needle gauge: 17 G ?Needle length: 9 cm and 9 ?Needle insertion depth: 6 cm ?Catheter type: closed end flexible ?Catheter size: 19 Gauge ?Catheter at skin depth: 11 cm ?Test dose: negative ? ?Assessment ?Events: blood not aspirated, injection not painful, no injection resistance, no paresthesia and negative IV test ? ?Additional Notes ?Patient identified. Risks/Benefits/Options discussed with patient including but not limited to bleeding, infection, nerve damage, paralysis, failed block, incomplete pain control, headache, blood pressure changes, nausea, vomiting, reactions to medication both or allergic, itching and postpartum back pain. Confirmed with bedside nurse the patient's most recent platelet count. Confirmed with patient that they are not currently taking any anticoagulation, have any bleeding history or any family history of bleeding disorders. Patient expressed understanding and wished to proceed. All questions were answered. Sterile technique was used throughout the entire procedure. Please see nursing notes for vital signs. Test dose was given through epidural needle and negative prior to continuing to dose epidural or start infusion. Warning signs of high block given to the patient including shortness of breath, tingling/numbness in hands, complete motor block, or any  concerning symptoms with instructions to call for help. Patient was given instructions on fall risk and not to get out of bed. All questions and concerns addressed with instructions to call with any issues.  1 Attempt (S) . Patient tolerated procedure well. ? ? ? ?

## 2021-06-23 ENCOUNTER — Inpatient Hospital Stay (HOSPITAL_COMMUNITY)
Admission: AD | Admit: 2021-06-23 | Payer: Medicaid Other | Source: Home / Self Care | Admitting: Obstetrics & Gynecology

## 2021-06-23 ENCOUNTER — Inpatient Hospital Stay (HOSPITAL_COMMUNITY): Payer: Medicaid Other

## 2021-06-23 DIAGNOSIS — D62 Acute posthemorrhagic anemia: Secondary | ICD-10-CM | POA: Diagnosis not present

## 2021-06-23 LAB — CBC
HCT: 31.4 % — ABNORMAL LOW (ref 36.0–46.0)
Hemoglobin: 10.8 g/dL — ABNORMAL LOW (ref 12.0–15.0)
MCH: 32.2 pg (ref 26.0–34.0)
MCHC: 34.4 g/dL (ref 30.0–36.0)
MCV: 93.7 fL (ref 80.0–100.0)
Platelets: 173 10*3/uL (ref 150–400)
RBC: 3.35 MIL/uL — ABNORMAL LOW (ref 3.87–5.11)
RDW: 13.1 % (ref 11.5–15.5)
WBC: 14.5 10*3/uL — ABNORMAL HIGH (ref 4.0–10.5)
nRBC: 0 % (ref 0.0–0.2)

## 2021-06-23 NOTE — Lactation Note (Signed)
This note was copied from a baby's chart. ?Lactation Consultation Note ? ?Patient Name: Alexis Henderson ?Today's Date: 06/23/2021 ?Reason for consult: Follow-up assessment;Mother's request;Term;Breastfeeding assistance ?Age:31 hours ? ?Infant adequate urine output, no stool but had meconium at delivery.  ?Mom states breastfeeding going well, infant sleeping in between feeds.  ? ?Plan 1. To feed based on cues 8-12x 24hr period.  ?2. Mom to latch at breast and look for signs of milk transfer.  ?3. Mom offer some colostrum if unable to latch on a spoon then try a latch.  ? ?All questions answered at the end of the visit.  ? ?Maternal Data ?  ? ?Feeding ?Mother's Current Feeding Choice: Breast Milk ? ?LATCH Score ?  ? ?  ? ?  ? ?  ? ?  ? ?  ? ? ?Lactation Tools Discussed/Used ?  ? ?Interventions ?Interventions: Breast feeding basics reviewed;Expressed milk;Education;Infant Driven Feeding Algorithm education ? ?Discharge ?  ? ?Consult Status ?Consult Status: Follow-up ?Date: 06/24/21 ?Follow-up type: In-patient ? ? ? ?Alexis Trueheart  Henderson ?06/23/2021, 7:09 PM ? ? ? ?

## 2021-06-23 NOTE — Progress Notes (Signed)
PPD# 1 SVD w/ intact perineum ?Information for the patient's newborn:  Lexxy, Deroy [id number]  ?female   ?Baby Name Michael Boston ?Circumcision in-patient ? ? ?S:   ?Reports feeling good ?Tolerating PO fluid and solids ?No nausea or vomiting ?Bleeding is light ?Pain controlled with  PO meds ?Up ad lib / ambulatory / voiding w/o difficulty ?Feeding: Breast  ? ? ?O:   ?VS: BP 118/70 (BP Location: Left Arm)   Pulse 78   Temp 97.8 ?F (36.6 ?C) (Oral)   Resp 18   Ht 5\' 3"  (1.6 m)   Wt 68.8 kg   SpO2 100%   Breastfeeding Unknown   BMI 26.87 kg/m?  ? ?LABS:  ?Recent Labs  ?  06/22/21 ?0436 06/23/21 ?0416  ?WBC 8.5 14.5*  ?HGB 12.8 10.8*  ?PLT 209 173  ? ?Blood type: --/--/O POS (04/11 [id number]) ?Rubella: Immune (09/13 0000)       ?             ?  ?I&O: Intake/Output   ?   04/11 0701 ?04/12 0700 04/12 0701 ?04/13 0700  ? Urine (mL/kg/hr) 850 (0.5)   ? Blood 100   ? Total Output 950   ? Net -950   ?     ? Urine Occurrence 1 x   ?  ? ?Physical Exam: ?Alert and oriented X3 ?Lungs: Clear and unlabored ?Heart: regular rate and rhythm / no mumurs ?Abdomen: soft, non-tender, non-distended  ?Fundus: firm, non-tender, U-2 ?Perineum: intact, edematous ?Lochia: appropriate ?Extremities: no edema, no calf pain, tenderness, or cords ?  ? ?A:  ?PPD # 1  ?Normal exam ? ?P:  ?Routine post partum orders ?Lactation support PRN ?Anticipate D/C on 06/24/21 ? ?Plan reviewed w/ Dr. Alwyn Pea ? ?Arrie Eastern, MSN, CNM ?06/23/2021, 7:17 AM ? ?

## 2021-06-24 ENCOUNTER — Other Ambulatory Visit (HOSPITAL_COMMUNITY): Payer: Self-pay

## 2021-06-24 MED ORDER — IBUPROFEN 600 MG PO TABS
600.0000 mg | ORAL_TABLET | Freq: Four times a day (QID) | ORAL | 0 refills | Status: AC
Start: 2021-06-24 — End: ?
  Filled 2021-06-24: qty 30, 8d supply, fill #0

## 2021-06-24 MED ORDER — ACETAMINOPHEN 325 MG PO TABS
650.0000 mg | ORAL_TABLET | ORAL | Status: AC | PRN
Start: 2021-06-24 — End: ?

## 2021-06-24 NOTE — Lactation Note (Signed)
This note was copied from a baby's chart. ?Lactation Consultation Note ? ?Patient Name: Alexis Henderson ?Today's Date: 06/24/2021 ?Reason for consult: Follow-up assessment;Term ?Age:31 hours ? ?LC in to visit with P3 Mom of term baby.  Baby at 6.3% weight loss with numerous voids but no stools.  Baby had heavy meconium at delivery and no stool since.   ? ?Mom reports baby is latching well.  LC asked to observe baby at the breast as there weren't any latch scores recorded.  Mom latch baby a little shallow initially in cross cradle hold.  Re-latched baby deeper and LC showed Mom and FOB how to untuck upper and lower lips for a better seal at the breast.  Baby sucking with deep jaw extensions and swallows identified. ? ?Reviewed hand expression.  Transitional milk squirted across the bed.  Mom informed on engorgement prevention and treatment. ? ?Encouraged STS with baby and offering the breast often with a deep latch with feeding cues. ? ?Mom aware of OP lactation support and encouraged to call prn. ? ?LATCH Score ?Latch: Grasps breast easily, tongue down, lips flanged, rhythmical sucking. ? ?Audible Swallowing: Spontaneous and intermittent ? ?Type of Nipple: Everted at rest and after stimulation ? ?Comfort (Breast/Nipple): Soft / non-tender ? ?Hold (Positioning): Assistance needed to correctly position infant at breast and maintain latch. ? ?LATCH Score: 9 ? ?Interventions ?Interventions: Breast feeding basics reviewed;Assisted with latch;Skin to skin;Breast massage;Hand express;Adjust position;Support pillows;Position options ? ?Discharge ?Discharge Education: Engorgement and breast care;Warning signs for feeding baby ?Pump: Personal (Medela) ? ?Consult Status ?Consult Status: Complete ?Date: 06/24/21 ?Follow-up type: In-patient ? ? ? ?Johny Blamer E ?06/24/2021, 10:49 AM ? ? ? ?

## 2021-06-24 NOTE — Discharge Summary (Addendum)
? ?  Postpartum Discharge Summary ? ?Date of Service updated 06/24/21 ?   ?Patient Name: Alexis Henderson ?DOB: [birth year] ?MRN: [id number] ? ?Date of admission: 06/22/2021 ?Delivery date:06/22/2021  ?Delivering provider: Sanjuana Kava  ?Date of discharge: 06/24/2021 ? ?Admitting diagnosis: Normal labor [O80, Z37.9] ?Intrauterine pregnancy: [redacted]w[redacted]d    ?Secondary diagnosis:  Principal Problem: ?  Normal labor ?Active Problems: ?  Acute blood loss anemia ?  SVD (4/11) ? ?Additional problems: none    ?Discharge diagnosis: Term Pregnancy Delivered and Anemia                                              ?Post partum procedures: none ?Augmentation: AROM ?Complications: None ? ?Hospital course: Onset of Labor With Vaginal Delivery      ?31y.o. yo [id number] admitted in Latent Labor on 06/22/2021. Patient had an uncomplicated labor course as follows:  ?Membrane Rupture Time/Date: 6:28 AM ,06/22/2021   ?Delivery Method:Vaginal, Spontaneous  ?Episiotomy: None  ?Lacerations:  None  ?Patient had an uncomplicated postpartum course.  She is ambulating, tolerating a regular diet, passing flatus, and urinating well. Patient is discharged home in stable condition on 06/24/21. ? ?Newborn Data: ?Birth date:06/22/2021  ?Birth time:10:36 AM  ?Gender:Female  ?Living status:Living  ?Apgars:4 ,9  ?Weight:3730 g  ? ?Magnesium Sulfate received: No ?BMZ received: No ?Rhophylac:N/A ?MMR:N/A ?T-DaP: declined ?Flu: No ?Transfusion:No ? ?Physical exam  ?Vitals:  ? 06/23/21 0524 06/23/21 1357 06/23/21 2130 06/24/21 0500  ?BP: 118/70 109/76 116/81 114/85  ?Pulse: 78 77 86 82  ?Resp: '18 17 17 16  ' ?Temp: 97.8 ?F (36.6 ?C) 97.8 ?F (36.6 ?C) 98.3 ?F (36.8 ?C) 98.4 ?F (36.9 ?C)  ?TempSrc: Oral Oral Oral Oral  ?SpO2: 100% 100% 100% 100%  ?Weight:      ?Height:      ? ?General: alert, cooperative, and no distress ?Lochia: appropriate ?Uterine Fundus: firm ?Incision: N/A ?DVT Evaluation: No evidence of DVT seen on physical exam. ?No cords or calf  tenderness. ?No significant calf/ankle edema. ?Labs: ?Lab Results  ?Component Value Date  ? WBC 14.5 (H) 06/23/2021  ? HGB 10.8 (L) 06/23/2021  ? HCT 31.4 (L) 06/23/2021  ? MCV 93.7 06/23/2021  ? PLT 173 06/23/2021  ? ? ?  Latest Ref Rng & Units 02/13/2013  ? 11:59 PM  ?CMP  ?Glucose 70 - 99 mg/dL 100    ?BUN 6 - 23 mg/dL 11    ?Creatinine 0.50 - 1.10 mg/dL 0.89    ?Sodium 135 - 145 mEq/L 132    ?Potassium 3.5 - 5.1 mEq/L 3.2    ?Chloride 96 - 112 mEq/L 96    ?CO2 19 - 32 mEq/L 22    ?Calcium 8.4 - 10.5 mg/dL 9.8    ? ?Edinburgh Score: ? ?  06/22/2021  ?  1:40 PM  ?EdFlavia Shipperostnatal Depression Scale Screening Tool  ?I have been able to laugh and see the funny side of things. 0  ?I have looked forward with enjoyment to things. 0  ?I have blamed myself unnecessarily when things went wrong. 0  ?I have been anxious or worried for no good reason. 0  ?I have felt scared or panicky for no good reason. 0  ?Things have been getting on top of me. 0  ?I have been so unhappy that I have had difficulty sleeping. 0  ?  I have felt sad or miserable. 0  ?I have been so unhappy that I have been crying. 0  ?The thought of harming myself has occurred to me. 0  ?Edinburgh Postnatal Depression Scale Total 0  ? ? ? ? ?After visit meds:  ?Allergies as of 06/24/2021   ?No Known Allergies ?  ? ?  ?Medication List  ?  ? ?TAKE these medications   ? ?acetaminophen 325 MG tablet ?Commonly known as: Tylenol ?Take 2 tablets (650 mg total) by mouth every 4 (four) hours as needed (for pain scale < 4). ?  ?cholecalciferol 25 MCG (1000 UNIT) tablet ?Commonly known as: VITAMIN D3 ?Take 1,000 Units by mouth daily. ?  ?ibuprofen 600 MG tablet ?Commonly known as: ADVIL ?Take 1 tablet (600 mg total) by mouth every 6 (six) hours. ?  ?prenatal multivitamin Tabs tablet ?Take 1 tablet by mouth daily at 12 noon. ?  ? ?  ? ? ? ?Discharge home in stable condition ?Infant Feeding: Breast ?Infant Disposition:home with mother ?Discharge instruction: per After Visit  Summary and Postpartum booklet. ?Activity: Advance as tolerated. Pelvic rest for 6 weeks.  ?Diet: routine diet ?Anticipated Birth Control: Plans Interval BTL ?Postpartum Appointment:6 weeks ?Additional Postpartum F/U:  none ?Future Appointments:No future appointments. ?Follow up Visit: ? Follow-up Information   ? ? [facility]. Go in 6 week(s).   ?Specialty: Obstetrics and Gynecology ?Contact information: ?[address] ?[address] ?[place] [address] ?[phone] ? ?  ?  ? ?  ?  ? ?  ? ? ? ?  ? ?06/24/2021 ?Arrie Eastern, CNM ? ? ?

## 2021-06-25 ENCOUNTER — Telehealth: Payer: Self-pay

## 2021-06-25 NOTE — Telephone Encounter (Signed)
Transition Care Management Unsuccessful Follow-up Telephone Call ? ?Date of discharge and from where:  06/24/2021-[facility] ? ?Attempts:  1st Attempt ? ?Reason for unsuccessful TCM follow-up call:  Left voice message ? ?  ?

## 2021-06-29 NOTE — Telephone Encounter (Signed)
Transition Care Management Unsuccessful Follow-up Telephone Call ? ?Date of discharge and from where:  06/24/2021-[facility] ? ?Attempts:  2nd Attempt ? ?Reason for unsuccessful TCM follow-up call:  Left voice message ? ?  ?

## 2021-06-30 NOTE — Telephone Encounter (Signed)
Transition Care Management Unsuccessful Follow-up Telephone Call ? ?Date of discharge and from where:  06/24/2021-[facility]  ? ?Attempts:  3rd Attempt ? ?Reason for unsuccessful TCM follow-up call:  Left voice message ? ?  ?

## 2021-07-02 ENCOUNTER — Telehealth (HOSPITAL_COMMUNITY): Payer: Self-pay

## 2021-07-02 NOTE — Telephone Encounter (Signed)
"  I'm doing good." Patient declines any questions or concerns about her healing. ? ?"He's good. Eating well and gaining weight. He sleeps in a bassinet." RN reviewed ABC's of safe sleep with patient. Patient declines any questions or concerns about baby. ? ?EPDS score is 0. ? ?Marcelino Duster Guelda Batson,RN3,MSN,RNC-MNN ?07/02/2021,1640 ?

## 2021-07-12 DIAGNOSIS — Z419 Encounter for procedure for purposes other than remedying health state, unspecified: Secondary | ICD-10-CM | POA: Diagnosis not present

## 2021-08-12 ENCOUNTER — Other Ambulatory Visit: Payer: Self-pay | Admitting: Obstetrics and Gynecology

## 2021-08-12 DIAGNOSIS — Z419 Encounter for procedure for purposes other than remedying health state, unspecified: Secondary | ICD-10-CM | POA: Diagnosis not present

## 2021-09-11 DIAGNOSIS — Z419 Encounter for procedure for purposes other than remedying health state, unspecified: Secondary | ICD-10-CM | POA: Diagnosis not present

## 2021-09-30 ENCOUNTER — Ambulatory Visit: Admit: 2021-09-30 | Payer: BC Managed Care – PPO | Admitting: Obstetrics and Gynecology

## 2021-09-30 SURGERY — SALPINGECTOMY, BILATERAL, LAPAROSCOPIC
Anesthesia: Choice | Laterality: Bilateral

## 2021-10-12 DIAGNOSIS — Z419 Encounter for procedure for purposes other than remedying health state, unspecified: Secondary | ICD-10-CM | POA: Diagnosis not present

## 2021-11-12 DIAGNOSIS — Z419 Encounter for procedure for purposes other than remedying health state, unspecified: Secondary | ICD-10-CM | POA: Diagnosis not present

## 2021-12-12 DIAGNOSIS — Z419 Encounter for procedure for purposes other than remedying health state, unspecified: Secondary | ICD-10-CM | POA: Diagnosis not present

## 2022-01-12 DIAGNOSIS — Z419 Encounter for procedure for purposes other than remedying health state, unspecified: Secondary | ICD-10-CM | POA: Diagnosis not present

## 2022-02-11 DIAGNOSIS — Z419 Encounter for procedure for purposes other than remedying health state, unspecified: Secondary | ICD-10-CM | POA: Diagnosis not present

## 2022-03-14 DIAGNOSIS — Z419 Encounter for procedure for purposes other than remedying health state, unspecified: Secondary | ICD-10-CM | POA: Diagnosis not present

## 2022-04-14 DIAGNOSIS — Z419 Encounter for procedure for purposes other than remedying health state, unspecified: Secondary | ICD-10-CM | POA: Diagnosis not present

## 2022-05-13 DIAGNOSIS — Z419 Encounter for procedure for purposes other than remedying health state, unspecified: Secondary | ICD-10-CM | POA: Diagnosis not present

## 2022-06-13 DIAGNOSIS — Z419 Encounter for procedure for purposes other than remedying health state, unspecified: Secondary | ICD-10-CM | POA: Diagnosis not present

## 2022-07-13 DIAGNOSIS — Z419 Encounter for procedure for purposes other than remedying health state, unspecified: Secondary | ICD-10-CM | POA: Diagnosis not present

## 2022-08-13 DIAGNOSIS — Z419 Encounter for procedure for purposes other than remedying health state, unspecified: Secondary | ICD-10-CM | POA: Diagnosis not present

## 2022-09-12 DIAGNOSIS — Z419 Encounter for procedure for purposes other than remedying health state, unspecified: Secondary | ICD-10-CM | POA: Diagnosis not present

## 2022-10-13 DIAGNOSIS — Z419 Encounter for procedure for purposes other than remedying health state, unspecified: Secondary | ICD-10-CM | POA: Diagnosis not present

## 2022-11-01 DIAGNOSIS — Z0000 Encounter for general adult medical examination without abnormal findings: Secondary | ICD-10-CM | POA: Diagnosis not present

## 2022-11-01 DIAGNOSIS — Z124 Encounter for screening for malignant neoplasm of cervix: Secondary | ICD-10-CM | POA: Diagnosis not present

## 2022-11-01 DIAGNOSIS — R5383 Other fatigue: Secondary | ICD-10-CM | POA: Diagnosis not present

## 2022-11-13 DIAGNOSIS — Z419 Encounter for procedure for purposes other than remedying health state, unspecified: Secondary | ICD-10-CM | POA: Diagnosis not present

## 2022-12-13 DIAGNOSIS — Z419 Encounter for procedure for purposes other than remedying health state, unspecified: Secondary | ICD-10-CM | POA: Diagnosis not present

## 2023-01-13 DIAGNOSIS — Z419 Encounter for procedure for purposes other than remedying health state, unspecified: Secondary | ICD-10-CM | POA: Diagnosis not present

## 2023-02-12 DIAGNOSIS — Z419 Encounter for procedure for purposes other than remedying health state, unspecified: Secondary | ICD-10-CM | POA: Diagnosis not present

## 2023-02-28 ENCOUNTER — Emergency Department (HOSPITAL_COMMUNITY): Payer: Medicaid Other

## 2023-02-28 ENCOUNTER — Emergency Department (HOSPITAL_COMMUNITY)
Admission: EM | Admit: 2023-02-28 | Discharge: 2023-02-28 | Disposition: A | Discharge status: Home / Self Care | Payer: Medicaid Other | Attending: Emergency Medicine | Admitting: Emergency Medicine

## 2023-02-28 ENCOUNTER — Encounter (HOSPITAL_COMMUNITY): Payer: Self-pay

## 2023-02-28 ENCOUNTER — Other Ambulatory Visit: Payer: Self-pay

## 2023-02-28 DIAGNOSIS — O209 Hemorrhage in early pregnancy, unspecified: Secondary | ICD-10-CM | POA: Diagnosis not present

## 2023-02-28 DIAGNOSIS — N939 Abnormal uterine and vaginal bleeding, unspecified: Secondary | ICD-10-CM | POA: Diagnosis not present

## 2023-02-28 DIAGNOSIS — Z3A01 Less than 8 weeks gestation of pregnancy: Secondary | ICD-10-CM | POA: Diagnosis not present

## 2023-02-28 DIAGNOSIS — O4690 Antepartum hemorrhage, unspecified, unspecified trimester: Secondary | ICD-10-CM | POA: Diagnosis not present

## 2023-02-28 LAB — BASIC METABOLIC PANEL
Anion gap: 9 (ref 5–15)
BUN: 11 mg/dL (ref 6–20)
CO2: 23 mmol/L (ref 22–32)
Calcium: 9.4 mg/dL (ref 8.9–10.3)
Chloride: 104 mmol/L (ref 98–111)
Creatinine, Ser: 0.59 mg/dL (ref 0.44–1.00)
GFR, Estimated: >60 mL/min (ref >60)
Glucose, Bld: 163 mg/dL — ABNORMAL HIGH (ref 70–99)
Potassium: 3.1 mmol/L — ABNORMAL LOW (ref 3.5–5.1)
Sodium: 136 mmol/L (ref 135–145)

## 2023-02-28 LAB — CBC WITH DIFFERENTIAL/PLATELET
Abs Immature Granulocytes: 0.03 10*3/uL (ref 0.00–0.07)
Basophils Absolute: 0 10*3/uL (ref 0.0–0.1)
Basophils Relative: 1 %
Eosinophils Absolute: 0 10*3/uL (ref 0.0–0.5)
Eosinophils Relative: 0 %
HCT: 33.4 % — ABNORMAL LOW (ref 36.0–46.0)
Hemoglobin: 11 g/dL — ABNORMAL LOW (ref 12.0–15.0)
Immature Granulocytes: 0 %
Lymphocytes Relative: 25 %
Lymphs Abs: 2.2 10*3/uL (ref 0.7–4.0)
MCH: 31.3 pg (ref 26.0–34.0)
MCHC: 32.9 g/dL (ref 30.0–36.0)
MCV: 94.9 fL (ref 80.0–100.0)
Monocytes Absolute: 0.4 10*3/uL (ref 0.1–1.0)
Monocytes Relative: 5 %
Neutro Abs: 6.1 10*3/uL (ref 1.7–7.7)
Neutrophils Relative %: 69 %
Platelets: 239 10*3/uL (ref 150–400)
RBC: 3.52 MIL/uL — ABNORMAL LOW (ref 3.87–5.11)
RDW: 12.6 % (ref 11.5–15.5)
WBC: 8.9 10*3/uL (ref 4.0–10.5)
nRBC: 0 % (ref 0.0–0.2)

## 2023-02-28 LAB — HCG, QUANTITATIVE, PREGNANCY: hCG, Beta Chain, Quant, S: 6249 m[IU]/mL — ABNORMAL HIGH (ref <5)

## 2023-02-28 NOTE — ED Triage Notes (Signed)
Patient has had vaginal bleeding since 445am today. Bright red blood clots the size of a golf ball. Complaining of abdominal cramping. Had a positive pregnancy test at urgent care today. Took abortion pills 11/9.

## 2023-02-28 NOTE — Discharge Instructions (Signed)
Thank you for coming to [facility] Emergency Department. You were seen for vaginal bleeding after elective abortion. Please follow up with your OB/Gyn on Friday for repeat evaluation and labs. They will call you to schedule this appt.   Do not hesitate to return to the ED or call 911 if you experience: -Worsening symptoms -Vaginal bleeding increasing, saturating >3 pads per hour for 2 hours -Severe abdominal pain -Lightheadedness, passing out -Fevers/chills -Anything else that concerns you

## 2023-02-28 NOTE — ED Provider Notes (Signed)
[place] EMERGENCY DEPARTMENT AT [facility] Provider Note   CSN: [id number] Arrival date & time: 02/28/23  1402     History  Chief Complaint  Patient presents with   Vaginal Bleeding    Alexis Henderson is a 32 y.o. female [phone] with no significant PMH who presents with vaginal bleeding.  Patient reports recent pregnancy, LMP was 12/08/22, and took misoprostol obtained from a clinic in [place] on 01/21/23. She had some bleeding and passage of clots which stopped after a couple of days. She has been feeling well until she started spotting about 4 days ago.  Then at 0445 this AM she started having heavier bleeding VB with passage of several large clots.  Patient is changing her pad every 45 minutes to 2 hours, has saturated 5 pads since 0445.  No lightheadedness or syncope, no CP, SOB.  [facility] and stated that her pregnancy test was still positive and she was referred for further evaluation.  Has three children born at term by SVD. Most recent pregnancy had elective medical abortion as well which was uncomplicated.  Past Medical History:  Diagnosis Date   No pertinent past medical history        Home Medications Prior to Admission medications   Medication Sig Start Date End Date Taking? Authorizing Provider  acetaminophen (TYLENOL) 325 MG tablet Take 2 tablets (650 mg total) by mouth every 4 (four) hours as needed (for pain scale < 4). 06/24/21   Roma Schanz, CNM  cholecalciferol (VITAMIN D3) 25 MCG (1000 UNIT) tablet Take 1,000 Units by mouth daily.    [provider]  ibuprofen (ADVIL) 600 MG tablet Take 1 tablet (600 mg total) by mouth every 6 (six) hours. 06/24/21   Roma Schanz, CNM  Prenatal Vit-Fe Fumarate-FA (PRENATAL MULTIVITAMIN) TABS tablet Take 1 tablet by mouth daily at 12 noon.    [provider]      Allergies    Patient has no known allergies.    Review of Systems   Review of Systems A 10 point review of systems  was performed and is negative unless otherwise reported in HPI.  Physical Exam Updated Vital Signs BP 103/75 (BP Location: Right Arm)   Pulse 75   Temp 98.6 F (37 C)   Resp 14   Ht 5\' 3"  (1.6 m)   Wt 54.9 kg   SpO2 100%   BMI 21.43 kg/m  Physical Exam General: Normal appearing female, lying in bed.  HEENT: Sclera anicteric, MMM, trachea midline.  Cardiology: RRR, no murmurs/rubs/gallops.  Resp: Normal respiratory rate and effort. CTAB, no wheezes, rhonchi, crackles.  Abd: Mild lower abdominal TTP. Soft, non-distended. No rebound tenderness or guarding.  Pelvic: Performed with nurse tech chaperone.  Normal-appearing external genitalia.  Mild amount of blood and small clots in vaginal vault.  No purulent discharge, no CMT.  Cervical os open to fingertip. MSK: No peripheral edema or signs of trauma.  Skin: warm, dry. Neuro: A&Ox4, CNs II-XII grossly intact. MAEs. Sensation grossly intact.  Psych: Normal mood and affect.   ED Results / Procedures / Treatments   Labs (all labs ordered are listed, but only abnormal results are displayed) Labs Reviewed  CBC WITH DIFFERENTIAL/PLATELET - Abnormal; Notable for the following components:      Result Value   RBC 3.52 (*)    Hemoglobin 11.0 (*)    HCT 33.4 (*)    All other components within normal limits  BASIC  METABOLIC PANEL - Abnormal; Notable for the following components:   Potassium 3.1 (*)    Glucose, Bld 163 (*)    All other components within normal limits  HCG, QUANTITATIVE, PREGNANCY - Abnormal; Notable for the following components:   hCG, Beta Chain, Quant, S 6,249 (*)    All other components within normal limits    EKG None  Radiology US OB Comp < 14 Wks Result Date: 02/28/2023 CLINICAL DATA:  Status post medical abortion. Vaginal bleeding. Quantitative beta HCG 6249. last menstrual period 12/08/2022 EXAM: OBSTETRIC <14 WK ULTRASOUND TECHNIQUE: Transabdominal ultrasound was performed for evaluation of the gestation as  well as the maternal uterus and adnexal regions. COMPARISON:  None Available. FINDINGS: Intrauterine gestational sac: None Yolk sac:  Not Visualized. Embryo:  Not Visualized. Cardiac Activity: Not Visualized. Heart Rate: Not applicable Subchorionic hemorrhage:  None visualized. Maternal uterus/adnexae: The uterus measures 9.9 x 4.9 x 6.3 cm. The endometrium measures 11 mm in thickness. Slightly increased vascularity. The right ovary measures 3.1 x 2.0 x 2.8 cm and left ovary measures 2.5 x 1.3 x 2.5 cm. There is a hypoechoic focus within the right ovary measuring 1.3 x 1.1 x 1.2 cm may represent a corpus luteum or hemorrhagic cyst. No paraovarian mass is seen. Trace free fluid is seen within the pelvis. IMPRESSION: 1. No live intrauterine pregnancy is seen. 2. No ultrasound evidence of ectopic pregnancy. 3. Recommend continued patient follow-up and correlation with serial quantitative beta HCG to follow this medical abortion and ensure the beta hCG trend downward to zero. Electronically Signed   By: Neita Garnet M.D.   On: 02/28/2023 20:29   US OB Transvaginal Result Date: 02/28/2023 CLINICAL DATA:  Status post medical abortion. Vaginal bleeding. Quantitative beta HCG 6249. last menstrual period 12/08/2022 EXAM: OBSTETRIC <14 WK ULTRASOUND TECHNIQUE: Transabdominal ultrasound was performed for evaluation of the gestation as well as the maternal uterus and adnexal regions. COMPARISON:  None Available. FINDINGS: Intrauterine gestational sac: None Yolk sac:  Not Visualized. Embryo:  Not Visualized. Cardiac Activity: Not Visualized. Heart Rate: Not applicable Subchorionic hemorrhage:  None visualized. Maternal uterus/adnexae: The uterus measures 9.9 x 4.9 x 6.3 cm. The endometrium measures 11 mm in thickness. Slightly increased vascularity. The right ovary measures 3.1 x 2.0 x 2.8 cm and left ovary measures 2.5 x 1.3 x 2.5 cm. There is a hypoechoic focus within the right ovary measuring 1.3 x 1.1 x 1.2 cm may  represent a corpus luteum or hemorrhagic cyst. No paraovarian mass is seen. Trace free fluid is seen within the pelvis. IMPRESSION: 1. No live intrauterine pregnancy is seen. 2. No ultrasound evidence of ectopic pregnancy. 3. Recommend continued patient follow-up and correlation with serial quantitative beta HCG to follow this medical abortion and ensure the beta hCG trend downward to zero. Electronically Signed   By: Neita Garnet M.D.   On: 02/28/2023 20:29    Procedures .Pelvic exam  Date/Time: 03/01/2023 12:00 AM  Performed by: Loetta Rough, MD Authorized by: Loetta Rough, MD  Consent: Verbal consent obtained. Risks and benefits: risks, benefits and alternatives were discussed Consent given by: patient Patient identity confirmed: verbally with patient Time out: Immediately prior to procedure a "time out" was called to verify the correct patient, procedure, equipment, support staff and site/side marked as required. Local anesthesia used: no  Anesthesia: Local anesthesia used: no  Sedation: Patient sedated: no  Patient tolerance: patient tolerated the procedure well with no immediate complications  Medications Ordered in ED Medications - No data to display  ED Course/ Medical Decision Making/ A&P                          Medical Decision Making Amount and/or Complexity of Data Reviewed Labs:  Decision-making details documented in ED Course. Radiology: ordered. Decision-making details documented in ED Course.    This patient presents to the ED for concern of VB, this involves an extensive number of treatment options, and is a complaint that carries with it a high risk of complications and morbidity.  I considered the following differential and admission for this acute, potentially life threatening condition.  Patient is hemodynamically stable and very well-appearing  MDM:    Consider retained products of conception, incomplete medical abortion, gestational  trophoblastic disease. Will get [place] for further eval. Will get CBC to ensure no anemia d/t blood loss. Patient is HDS and no tachycardia/hypotension to indicate hemorrhage or hypovolemia. Considered septic abortion - lower concern with no fevers/chills, no purulent vaginal discharge. She has no leukocytosis, no purulent discharge in the vagina. Considered RhoGam, but mom is O positive.  Low concern for other causes of abdominal discomfort including appendicitis, diverticulitis, hepatobiliary disease with no right upper quadrant tenderness to palpation.   Clinical Course as of 03/01/23 0000  Tue Feb 28, 2023  1648 HCG, Beta Chain, [place] Longest(!): 6,249 +HCG [HN]  1648 Hemoglobin(!): 11.0 stable [HN]  1706 WBC: 8.9 No leukocytosis  [HN]  2037 US OB Comp < 14 Wks 1. No live intrauterine pregnancy is seen. 2. No ultrasound evidence of ectopic pregnancy. 3. Recommend continued patient follow-up and correlation with serial quantitative beta HCG to follow this medical abortion and ensure the beta hCG trend downward to zero.  [HN]    Clinical Course User Index [HN] Loetta Rough, MD       Labs: I Ordered, and personally interpreted labs.  The pertinent results include:  those listed above  Imaging Studies ordered: I ordered imaging studies including pelvic US I independently visualized and interpreted imaging. I agree with the radiologist interpretation  Additional history obtained from chart review.    Reevaluation: After the interventions noted above, I reevaluated the patient and found that they have :improved  Social Determinants of Health: Lives independently  Disposition:  Patient with vaginal bleeding ~6 weeks after medical abortion, and bleeding improved while in the ED. Patient with reassuring pelvic exam and labs. Consulted with OB, who recommended close f/u in the clinic this week and have requested her an appointment. Patient will have repeat beta HCG and if not  significantly downtrending, will plan for o/p D&C. I discussed this with the patient who reports understanding.  Patient is extremely well-appearing and hemodynamically stable, do not believe she requires admission.  She is instructed to take Tylenol for pain at home. DC w/ discharge instructions/return precautions. All questions answered to patient's satisfaction.    Co morbidities that complicate the patient evaluation  Past Medical History:  Diagnosis Date   No pertinent past medical history      Medicines No orders of the defined types were placed in this encounter.   I have reviewed the patients home medicines and have made adjustments as needed  Problem List / ED Course: Problem List Items Addressed This Visit   None Visit Diagnoses       Vaginal bleeding    -  Primary  This note was created using dictation software, which may contain spelling or grammatical errors.    Loetta Rough, MD 03/03/23 [phone]

## 2023-02-28 NOTE — ED Provider Triage Note (Signed)
Emergency Medicine Provider Triage Evaluation Note  Alexis Henderson , a 32 y.o. female  was evaluated in triage.  Pt complains of vaginal bleeding.  Patient reports recent pregnancy.  She took " abortion pills" obtained from a clinic in [place] about 6 weeks ago.  She states that she started having some spotting about 4 days ago.  Heavier bleeding over the past day with passage of clots.  Patient is changing her pad every 45 minutes to 2 hours.  No lightheadedness or syncope.  [facility] and stated that her pregnancy test was still positive and she was referred for further evaluation.  Review of Systems  Positive: Vaginal bleeding Negative: Syncope  Physical Exam  BP 112/70 (BP Location: Right Arm)   Pulse 89   Temp 98.8 F (37.1 C) (Oral)   Resp 16   SpO2 100%  Gen:   Awake, no distress   Resp:  Normal effort  MSK:   Moves extremities without difficulty  Other:   Medical Decision Making  Medically screening exam initiated at 2:20 PM.  Appropriate orders placed.  Alexis Henderson was informed that the remainder of the evaluation will be completed by another provider, this initial triage assessment does not replace that evaluation, and the importance of remaining in the ED until their evaluation is complete.     Renne Crigler, PA-C 02/28/23 1423

## 2023-03-01 NOTE — ED Provider Notes (Incomplete)
[place] EMERGENCY DEPARTMENT AT [facility] Provider Note   CSN: [id number] Arrival date & time: 02/28/23  1402     History {Add pertinent medical, surgical, social history, OB history to HPI:1} Chief Complaint  Patient presents with  . Vaginal Bleeding    Alexis Henderson is a 32 y.o. female [id number] with no significant PMH who presents with vaginal bleeding.  Patient reports recent pregnancy, LMP was 12/08/22, and took misoprostol obtained from a clinic in [place] on 01/21/23. She had some bleeding and passage of clots which stopped after a couple of days. She has been feeling well until she started spotting about 4 days ago.  Then at 0445 this AM she started having heavier bleeding VB with passage of several large clots.  Patient is changing her pad every 45 minutes to 2 hours, has saturated 5 pads since 0445.  No lightheadedness or syncope, no CP, SOB.  [facility] and stated that her pregnancy test was still positive and she was referred for further evaluation.  Has three children born at term by SVD. Most recent pregnancy had elective medical abortion as well which was uncomplicated.  Past Medical History:  Diagnosis Date  . No pertinent past medical history        Home Medications Prior to Admission medications   Medication Sig Start Date End Date Taking? Authorizing Provider  acetaminophen (TYLENOL) 325 MG tablet Take 2 tablets (650 mg total) by mouth every 4 (four) hours as needed (for pain scale < 4). 06/24/21   Roma Schanz, CNM  cholecalciferol (VITAMIN D3) 25 MCG (1000 UNIT) tablet Take 1,000 Units by mouth daily.    [provider]  ibuprofen (ADVIL) 600 MG tablet Take 1 tablet (600 mg total) by mouth every 6 (six) hours. 06/24/21   Roma Schanz, CNM  Prenatal Vit-Fe Fumarate-FA (PRENATAL MULTIVITAMIN) TABS tablet Take 1 tablet by mouth daily at 12 noon.    [provider]      Allergies    Patient has no known allergies.     Review of Systems   Review of Systems A 10 point review of systems was performed and is negative unless otherwise reported in HPI.  Physical Exam Updated Vital Signs BP 103/75 (BP Location: Right Arm)   Pulse 75   Temp 98.6 F (37 C)   Resp 14   Ht 5\' 3"  (1.6 m)   Wt 54.9 kg   SpO2 100%   BMI 21.43 kg/m  Physical Exam General: Normal appearing female, lying in bed.  HEENT: Sclera anicteric, MMM, trachea midline.  Cardiology: RRR, no murmurs/rubs/gallops.  Resp: Normal respiratory rate and effort. CTAB, no wheezes, rhonchi, crackles.  Abd: Mild lower abdominal TTP. Soft, non-distended. No rebound tenderness or guarding.  Pelvic: Performed with nurse tech chaperone.  Normal-appearing external genitalia.  Mild amount of blood and small clots in vaginal vault.  No purulent discharge, no CMT.  Cervical os open to fingertip. MSK: No peripheral edema or signs of trauma.  Skin: warm, dry. Neuro: A&Ox4, CNs II-XII grossly intact. MAEs. Sensation grossly intact.  Psych: Normal mood and affect.   ED Results / Procedures / Treatments   Labs (all labs ordered are listed, but only abnormal results are displayed) Labs Reviewed  CBC WITH DIFFERENTIAL/PLATELET - Abnormal; Notable for the following components:      Result Value   RBC 3.52 (*)    Hemoglobin 11.0 (*)    HCT 33.4 (*)  All other components within normal limits  BASIC METABOLIC PANEL - Abnormal; Notable for the following components:   Potassium 3.1 (*)    Glucose, Bld 163 (*)    All other components within normal limits  HCG, QUANTITATIVE, PREGNANCY - Abnormal; Notable for the following components:   hCG, Beta Chain, Quant, S 6,249 (*)    All other components within normal limits    EKG None  Radiology US OB Comp < 14 Wks Result Date: 02/28/2023 CLINICAL DATA:  Status post medical abortion. Vaginal bleeding. Quantitative beta HCG 6249. last menstrual period 12/08/2022 EXAM: OBSTETRIC <14 WK ULTRASOUND TECHNIQUE:  Transabdominal ultrasound was performed for evaluation of the gestation as well as the maternal uterus and adnexal regions. COMPARISON:  None Available. FINDINGS: Intrauterine gestational sac: None Yolk sac:  Not Visualized. Embryo:  Not Visualized. Cardiac Activity: Not Visualized. Heart Rate: Not applicable Subchorionic hemorrhage:  None visualized. Maternal uterus/adnexae: The uterus measures 9.9 x 4.9 x 6.3 cm. The endometrium measures 11 mm in thickness. Slightly increased vascularity. The right ovary measures 3.1 x 2.0 x 2.8 cm and left ovary measures 2.5 x 1.3 x 2.5 cm. There is a hypoechoic focus within the right ovary measuring 1.3 x 1.1 x 1.2 cm may represent a corpus luteum or hemorrhagic cyst. No paraovarian mass is seen. Trace free fluid is seen within the pelvis. IMPRESSION: 1. No live intrauterine pregnancy is seen. 2. No ultrasound evidence of ectopic pregnancy. 3. Recommend continued patient follow-up and correlation with serial quantitative beta HCG to follow this medical abortion and ensure the beta hCG trend downward to zero. Electronically Signed   By: Neita Garnet M.D.   On: 02/28/2023 20:29   US OB Transvaginal Result Date: 02/28/2023 CLINICAL DATA:  Status post medical abortion. Vaginal bleeding. Quantitative beta HCG 6249. last menstrual period 12/08/2022 EXAM: OBSTETRIC <14 WK ULTRASOUND TECHNIQUE: Transabdominal ultrasound was performed for evaluation of the gestation as well as the maternal uterus and adnexal regions. COMPARISON:  None Available. FINDINGS: Intrauterine gestational sac: None Yolk sac:  Not Visualized. Embryo:  Not Visualized. Cardiac Activity: Not Visualized. Heart Rate: Not applicable Subchorionic hemorrhage:  None visualized. Maternal uterus/adnexae: The uterus measures 9.9 x 4.9 x 6.3 cm. The endometrium measures 11 mm in thickness. Slightly increased vascularity. The right ovary measures 3.1 x 2.0 x 2.8 cm and left ovary measures 2.5 x 1.3 x 2.5 cm. There is a  hypoechoic focus within the right ovary measuring 1.3 x 1.1 x 1.2 cm may represent a corpus luteum or hemorrhagic cyst. No paraovarian mass is seen. Trace free fluid is seen within the pelvis. IMPRESSION: 1. No live intrauterine pregnancy is seen. 2. No ultrasound evidence of ectopic pregnancy. 3. Recommend continued patient follow-up and correlation with serial quantitative beta HCG to follow this medical abortion and ensure the beta hCG trend downward to zero. Electronically Signed   By: Neita Garnet M.D.   On: 02/28/2023 20:29    Procedures .Pelvic exam  Date/Time: 03/01/2023 12:00 AM  Performed by: Loetta Rough, MD Authorized by: Loetta Rough, MD  Consent: Verbal consent obtained. Risks and benefits: risks, benefits and alternatives were discussed Consent given by: patient Patient identity confirmed: verbally with patient Time out: Immediately prior to procedure a "time out" was called to verify the correct patient, procedure, equipment, support staff and site/side marked as required. Local anesthesia used: no  Anesthesia: Local anesthesia used: no  Sedation: Patient sedated: no  Patient tolerance: patient tolerated the procedure well with  no immediate complications     {Document cardiac monitor, telemetry assessment procedure when appropriate:1}  Medications Ordered in ED Medications - No data to display  ED Course/ Medical Decision Making/ A&P                          Medical Decision Making Amount and/or Complexity of Data Reviewed Labs:  Decision-making details documented in ED Course. Radiology: ordered. Decision-making details documented in ED Course.    This patient presents to the ED for concern of VB, this involves an extensive number of treatment options, and is a complaint that carries with it a high risk of complications and morbidity.  I considered the following differential and admission for this acute, potentially life threatening condition.   MDM:     Consider retained products of conception, incomplete medical abortion, gestational trophoblastic disease.. Will get [place] for further eval. Will get CBC to ensure no anemia d/t blood loss. Patient is HDS and no tachycardia/hypotension to indicate hemorrhage or hypovolemia. Considered septic abortion - lower concern with no fevers/chills, no purulent vaginal discharge. She has no leukocytosis. Considered RhoGam, but mom is O positive.   Clinical Course as of 03/01/23 0000  Tue Feb 28, 2023  1648 HCG, Beta Chain, [place] Longest(!): 6,249 +HCG [HN]  1648 Hemoglobin(!): 11.0 stable [HN]  1706 WBC: 8.9 No leukocytosis  [HN]  2037 US OB Comp < 14 Wks 1. No live intrauterine pregnancy is seen. 2. No ultrasound evidence of ectopic pregnancy. 3. Recommend continued patient follow-up and correlation with serial quantitative beta HCG to follow this medical abortion and ensure the beta hCG trend downward to zero.  [HN]    Clinical Course User Index [HN] Loetta Rough, MD    Labs: I Ordered, and personally interpreted labs.  The pertinent results include:  those listed above  Imaging Studies ordered: I ordered imaging studies including pelvic US I independently visualized and interpreted imaging. I agree with the radiologist interpretation  Additional history obtained from chart review.    Reevaluation: After the interventions noted above, I reevaluated the patient and found that they have :improved  Social Determinants of Health: .Lives independently  Disposition:  DC w/ discharge instructions/return precautions. All questions answered to patient's satisfaction.    Co morbidities that complicate the patient evaluation . Past Medical History:  Diagnosis Date  . No pertinent past medical history      Medicines No orders of the defined types were placed in this encounter.   I have reviewed the patients home medicines and have made adjustments as needed  Problem List / ED  Course: Problem List Items Addressed This Visit   None Visit Diagnoses       Vaginal bleeding    -  Primary            {Document critical care time when appropriate:1} {Document review of labs and clinical decision tools ie heart score, Chads2Vasc2 etc:1}  {Document your independent review of radiology images, and any outside records:1} {Document your discussion with family members, caretakers, and with consultants:1} {Document social determinants of health affecting pt's care:1} {Document your decision making why or why not admission, treatments were needed:1}  This note was created using dictation software, which may contain spelling or grammatical errors.

## 2023-03-03 DIAGNOSIS — Z01419 Encounter for gynecological examination (general) (routine) without abnormal findings: Secondary | ICD-10-CM | POA: Diagnosis not present

## 2023-03-03 DIAGNOSIS — O021 Missed abortion: Secondary | ICD-10-CM | POA: Diagnosis not present

## 2023-03-15 DIAGNOSIS — Z419 Encounter for procedure for purposes other than remedying health state, unspecified: Secondary | ICD-10-CM | POA: Diagnosis not present

## 2023-04-06 DIAGNOSIS — O021 Missed abortion: Secondary | ICD-10-CM | POA: Diagnosis not present

## 2023-04-15 DIAGNOSIS — Z419 Encounter for procedure for purposes other than remedying health state, unspecified: Secondary | ICD-10-CM | POA: Diagnosis not present

## 2023-05-13 DIAGNOSIS — Z419 Encounter for procedure for purposes other than remedying health state, unspecified: Secondary | ICD-10-CM | POA: Diagnosis not present

## 2023-06-24 DIAGNOSIS — Z419 Encounter for procedure for purposes other than remedying health state, unspecified: Secondary | ICD-10-CM | POA: Diagnosis not present

## 2024-02-20 DIAGNOSIS — Z369 Encounter for antenatal screening, unspecified: Secondary | ICD-10-CM | POA: Diagnosis not present

## 2024-02-20 DIAGNOSIS — Z331 Pregnant state, incidental: Secondary | ICD-10-CM | POA: Diagnosis not present

## 2024-02-20 DIAGNOSIS — O3680X9 Pregnancy with inconclusive fetal viability, other fetus: Secondary | ICD-10-CM | POA: Diagnosis not present

## 2024-02-20 DIAGNOSIS — Z3A08 8 weeks gestation of pregnancy: Secondary | ICD-10-CM | POA: Diagnosis not present

## 2024-02-20 DIAGNOSIS — Z113 Encounter for screening for infections with a predominantly sexual mode of transmission: Secondary | ICD-10-CM | POA: Diagnosis not present

## 2024-02-20 DIAGNOSIS — E559 Vitamin D deficiency, unspecified: Secondary | ICD-10-CM | POA: Diagnosis not present

## 2024-02-23 DIAGNOSIS — Z419 Encounter for procedure for purposes other than remedying health state, unspecified: Secondary | ICD-10-CM | POA: Diagnosis not present
